# Patient Record
Sex: Male | Born: 2000 | Race: Black or African American | Hispanic: No | Marital: Single | Smoking: Never smoker
Health system: Southern US, Community
[De-identification: ages and names within clinical notes are randomized; demographics above are authoritative.]

## PROBLEM LIST (undated history)

## (undated) DIAGNOSIS — E119 Type 2 diabetes mellitus without complications: Secondary | ICD-10-CM

---
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
## 2019-05-16 ENCOUNTER — Encounter (HOSPITAL_COMMUNITY): Payer: Self-pay | Admitting: *Deleted

## 2019-05-16 ENCOUNTER — Other Ambulatory Visit: Payer: Self-pay

## 2019-05-16 ENCOUNTER — Ambulatory Visit (HOSPITAL_COMMUNITY)
Admission: EM | Admit: 2019-05-16 | Discharge: 2019-05-16 | Disposition: A | Discharge status: Home / Self Care | Payer: Medicaid Other | Attending: Urgent Care | Admitting: Urgent Care

## 2019-05-16 DIAGNOSIS — S39012A Strain of muscle, fascia and tendon of lower back, initial encounter: Secondary | ICD-10-CM | POA: Diagnosis not present

## 2019-05-16 DIAGNOSIS — M545 Low back pain, unspecified: Secondary | ICD-10-CM

## 2019-05-16 MED ORDER — NAPROXEN 500 MG PO TABS: 500.0000 mg | ORAL_TABLET | Freq: Two times a day (BID) | ORAL | 0 refills | Status: DC

## 2019-05-16 MED ORDER — TIZANIDINE HCL 4 MG PO TABS: 4.0000 mg | ORAL_TABLET | Freq: Three times a day (TID) | ORAL | 0 refills | Status: DC | PRN
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
## 2019-05-16 NOTE — ED Provider Notes (Signed)
[facility]   MRN: [id number] DOB: [birth year]  Subjective:   Jacob Esparza is a 19 y.o. male presenting for 2 day hx of acute onset of low back pain after bending down to pick something up.  Had significant difficulty moving around thereafter due to his pain.  States that it radiates to both of his legs.  Has not tried any medications for relief.  He missed work yesterday and would like a note for work.  Denies fever, dysuria, urinary frequency, hematuria, history of kidney stone, history of back injury or surgery, weakness, incontinence.  Denies taking chronic medications.    No Known Allergies  History reviewed. No pertinent past medical history.   History reviewed. No pertinent surgical history.  Family History  Problem Relation Age of Onset  . Healthy Mother   . Diabetes Father     Social History   Tobacco Use  . Smoking status: Never Smoker  . Smokeless tobacco: Never Used  Substance Use Topics  . Alcohol use: Not Currently  . Drug use: Never    ROS   Objective:   Vitals: BP 138/82   Pulse 75   Temp 98.4 F (36.9 C) (Oral)   Resp 18   SpO2 100%   Physical Exam Constitutional:      General: He is not in acute distress.    Appearance: Normal appearance. He is well-developed and normal weight. He is not ill-appearing, toxic-appearing or diaphoretic.  HENT:     Head: Normocephalic and atraumatic.     Right Ear: External ear normal.     Left Ear: External ear normal.     Nose: Nose normal.     Mouth/Throat:     Pharynx: Oropharynx is clear.  Eyes:     General: No scleral icterus.       Right eye: No discharge.        Left eye: No discharge.     Extraocular Movements: Extraocular movements intact.     Pupils: Pupils are equal, round, and reactive to light.  Cardiovascular:     Rate and Rhythm: Normal rate.  Pulmonary:     Effort: Pulmonary effort is normal.  Musculoskeletal:     Cervical back: Normal range of motion.     Lumbar back:  Spasms (over paraspinal muscles) and tenderness present. No swelling, edema, deformity, signs of trauma, lacerations or bony tenderness. Decreased range of motion (full flexion and extension). Negative right straight leg raise test and negative left straight leg raise test. No scoliosis.       Back:  Neurological:     Mental Status: He is alert and oriented to person, place, and time.  Psychiatric:        Mood and Affect: Mood normal.        Behavior: Behavior normal.        Thought Content: Thought content normal.        Judgment: Judgment normal.      Assessment and Plan :   1. Strain of lumbar region, initial encounter   2. Acute bilateral low back pain without sciatica     Will use conservative management for lumbar strain including rest, NSAID, muscle relaxant.  Provided with note for work.  Counseled on back care.  Given lack of trauma and reassuring physical exam findings, vital signs will defer imaging. Counseled patient on potential for adverse effects with medications prescribed/recommended today, ER and return-to-clinic precautions discussed, patient verbalized understanding.    Wallis Bamberg, PA-C 05/16/19  1550  

## 2019-07-29 ENCOUNTER — Other Ambulatory Visit: Payer: Self-pay

## 2019-07-29 DIAGNOSIS — M545 Low back pain: Secondary | ICD-10-CM | POA: Insufficient documentation

## 2019-07-29 DIAGNOSIS — G8929 Other chronic pain: Secondary | ICD-10-CM | POA: Insufficient documentation

## 2019-07-30 ENCOUNTER — Encounter (HOSPITAL_COMMUNITY): Payer: Self-pay | Admitting: Emergency Medicine

## 2019-07-30 ENCOUNTER — Emergency Department (HOSPITAL_COMMUNITY)
Admission: EM | Admit: 2019-07-30 | Discharge: 2019-07-30 | Disposition: A | Discharge status: Home / Self Care | Payer: No Typology Code available for payment source | Attending: Emergency Medicine | Admitting: Emergency Medicine

## 2019-07-30 ENCOUNTER — Other Ambulatory Visit: Payer: Self-pay

## 2019-07-30 DIAGNOSIS — M545 Low back pain, unspecified: Secondary | ICD-10-CM

## 2019-07-30 MED ORDER — NAPROXEN 500 MG PO TABS: 500.0000 mg | ORAL_TABLET | Freq: Two times a day (BID) | ORAL | 0 refills | Status: DC

## 2019-07-30 MED ORDER — METHOCARBAMOL 500 MG PO TABS: 500.0000 mg | ORAL_TABLET | Freq: Two times a day (BID) | ORAL | 0 refills | Status: DC

## 2019-07-30 MED ORDER — LIDOCAINE 5 % EX PTCH
1.0000 | MEDICATED_PATCH | Freq: Once | CUTANEOUS | Status: DC
  Administered 2019-07-30: 1 via TRANSDERMAL
  Filled 2019-07-30: qty 1

## 2019-07-30 MED ORDER — LIDOCAINE 5 % EX PTCH: 1.0000 | MEDICATED_PATCH | CUTANEOUS | 0 refills | Status: DC
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
## 2019-07-30 NOTE — Discharge Instructions (Signed)
Please pick up medication and take as prescribed Follow up with [facility] for primary care needs. They will need to reassess you given your pain has been persistent for months.  Return to the ED for any worsening symptoms including worsening pain, inability to urinate, peeing or pooping on yourself, numbness in your groin area, inability to walk.
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
## 2019-11-22 ENCOUNTER — Emergency Department (HOSPITAL_COMMUNITY)
Admission: EM | Admit: 2019-11-22 | Discharge: 2019-11-23 | Disposition: A | Discharge status: Home / Self Care | Payer: Medicaid Other | Attending: Emergency Medicine | Admitting: Emergency Medicine

## 2019-11-22 ENCOUNTER — Encounter (HOSPITAL_COMMUNITY): Payer: Self-pay | Admitting: Emergency Medicine

## 2019-11-22 ENCOUNTER — Other Ambulatory Visit: Payer: Self-pay

## 2019-11-22 DIAGNOSIS — Z5321 Procedure and treatment not carried out due to patient leaving prior to being seen by health care provider: Secondary | ICD-10-CM | POA: Diagnosis not present

## 2019-11-22 DIAGNOSIS — R109 Unspecified abdominal pain: Secondary | ICD-10-CM | POA: Diagnosis not present

## 2019-11-22 LAB — CBC
HCT: 49.7 % (ref 39.0–52.0)
Hemoglobin: 16.8 g/dL (ref 13.0–17.0)
MCH: 29.2 pg (ref 26.0–34.0)
MCHC: 33.8 g/dL (ref 30.0–36.0)
MCV: 86.3 fL (ref 80.0–100.0)
Platelets: 230 10*3/uL (ref 150–400)
RBC: 5.76 MIL/uL (ref 4.22–5.81)
RDW: 12.6 % (ref 11.5–15.5)
WBC: 4.3 10*3/uL (ref 4.0–10.5)
nRBC: 0 % (ref 0.0–0.2)

## 2019-11-22 NOTE — ED Triage Notes (Signed)
Pt c/o abdominal pain x 2 days. Denies nausea/vomiting/diarrhea.

## 2019-11-22 NOTE — ED Notes (Signed)
Pt found sitting on the floor in triage room. States he doesn't feel well, like he is going to pass out.

## 2019-11-22 NOTE — ED Notes (Signed)
Pt called for triage, no response. 

## 2019-11-23 LAB — LIPASE, BLOOD: Lipase: 27 U/L (ref 11–51)

## 2019-11-23 LAB — COMPREHENSIVE METABOLIC PANEL
ALT: 48 U/L — ABNORMAL HIGH (ref 0–44)
AST: 33 U/L (ref 15–41)
Albumin: 4.6 g/dL (ref 3.5–5.0)
Alkaline Phosphatase: 122 U/L (ref 38–126)
Anion gap: 17 — ABNORMAL HIGH (ref 5–15)
BUN: 12 mg/dL (ref 6–20)
CO2: 20 mmol/L — ABNORMAL LOW (ref 22–32)
Calcium: 10.2 mg/dL (ref 8.9–10.3)
Chloride: 96 mmol/L — ABNORMAL LOW (ref 98–111)
Creatinine, Ser: 1.36 mg/dL — ABNORMAL HIGH (ref 0.61–1.24)
GFR calc Af Amer: >60 mL/min (ref >60)
GFR calc non Af Amer: >60 mL/min (ref >60)
Glucose, Bld: 460 mg/dL — ABNORMAL HIGH (ref 70–99)
Potassium: 4.7 mmol/L (ref 3.5–5.1)
Sodium: 133 mmol/L — ABNORMAL LOW (ref 135–145)
Total Bilirubin: 1.2 mg/dL (ref 0.3–1.2)
Total Protein: 9.3 g/dL — ABNORMAL HIGH (ref 6.5–8.1)

## 2019-11-23 NOTE — ED Notes (Signed)
Pt left the waiting room

## 2019-11-24 ENCOUNTER — Inpatient Hospital Stay (HOSPITAL_COMMUNITY)
Admission: EM | Admit: 2019-11-24 | Discharge: 2019-11-28 | DRG: 177 | Disposition: A | Discharge status: Home / Self Care | Payer: Medicaid Other | Attending: Internal Medicine | Admitting: Internal Medicine

## 2019-11-24 ENCOUNTER — Emergency Department (HOSPITAL_COMMUNITY): Payer: Medicaid Other

## 2019-11-24 ENCOUNTER — Encounter (HOSPITAL_COMMUNITY): Payer: Self-pay | Admitting: Obstetrics and Gynecology

## 2019-11-24 ENCOUNTER — Other Ambulatory Visit: Payer: Self-pay

## 2019-11-24 DIAGNOSIS — Z833 Family history of diabetes mellitus: Secondary | ICD-10-CM | POA: Diagnosis not present

## 2019-11-24 DIAGNOSIS — E876 Hypokalemia: Secondary | ICD-10-CM | POA: Diagnosis not present

## 2019-11-24 DIAGNOSIS — J1282 Pneumonia due to coronavirus disease 2019: Secondary | ICD-10-CM

## 2019-11-24 DIAGNOSIS — E875 Hyperkalemia: Secondary | ICD-10-CM | POA: Diagnosis present

## 2019-11-24 DIAGNOSIS — U071 COVID-19: Secondary | ICD-10-CM | POA: Diagnosis present

## 2019-11-24 DIAGNOSIS — E860 Dehydration: Secondary | ICD-10-CM | POA: Diagnosis present

## 2019-11-24 DIAGNOSIS — D751 Secondary polycythemia: Secondary | ICD-10-CM | POA: Diagnosis present

## 2019-11-24 DIAGNOSIS — R112 Nausea with vomiting, unspecified: Secondary | ICD-10-CM | POA: Diagnosis present

## 2019-11-24 DIAGNOSIS — Z6841 Body Mass Index (BMI) 40.0 and over, adult: Secondary | ICD-10-CM

## 2019-11-24 DIAGNOSIS — E119 Type 2 diabetes mellitus without complications: Secondary | ICD-10-CM

## 2019-11-24 DIAGNOSIS — E1110 Type 2 diabetes mellitus with ketoacidosis without coma: Secondary | ICD-10-CM | POA: Diagnosis present

## 2019-11-24 DIAGNOSIS — E0810 Diabetes mellitus due to underlying condition with ketoacidosis without coma: Secondary | ICD-10-CM

## 2019-11-24 DIAGNOSIS — N179 Acute kidney failure, unspecified: Secondary | ICD-10-CM | POA: Diagnosis present

## 2019-11-24 LAB — CBC
HCT: 58.3 % — ABNORMAL HIGH (ref 39.0–52.0)
Hemoglobin: 18.8 g/dL — ABNORMAL HIGH (ref 13.0–17.0)
MCH: 29.6 pg (ref 26.0–34.0)
MCHC: 32.2 g/dL (ref 30.0–36.0)
MCV: 91.8 fL (ref 80.0–100.0)
Platelets: 264 10*3/uL (ref 150–400)
RBC: 6.35 MIL/uL — ABNORMAL HIGH (ref 4.22–5.81)
RDW: 13.3 % (ref 11.5–15.5)
WBC: 8.4 10*3/uL (ref 4.0–10.5)
nRBC: 0 % (ref 0.0–0.2)

## 2019-11-24 LAB — COMPREHENSIVE METABOLIC PANEL
ALT: 45 U/L — ABNORMAL HIGH (ref 0–44)
AST: 39 U/L (ref 15–41)
Albumin: 4.7 g/dL (ref 3.5–5.0)
Alkaline Phosphatase: 123 U/L (ref 38–126)
BUN: 27 mg/dL — ABNORMAL HIGH (ref 6–20)
CO2: <7 mmol/L — ABNORMAL LOW (ref 22–32)
Calcium: 9.6 mg/dL (ref 8.9–10.3)
Chloride: 96 mmol/L — ABNORMAL LOW (ref 98–111)
Creatinine, Ser: 1.87 mg/dL — ABNORMAL HIGH (ref 0.61–1.24)
GFR calc Af Amer: 59 mL/min — ABNORMAL LOW (ref >60)
GFR calc non Af Amer: 51 mL/min — ABNORMAL LOW (ref >60)
Glucose, Bld: 697 mg/dL (ref 70–99)
Potassium: 6 mmol/L — ABNORMAL HIGH (ref 3.5–5.1)
Sodium: 130 mmol/L — ABNORMAL LOW (ref 135–145)
Total Bilirubin: 1.5 mg/dL — ABNORMAL HIGH (ref 0.3–1.2)
Total Protein: 10.3 g/dL — ABNORMAL HIGH (ref 6.5–8.1)

## 2019-11-24 LAB — URINALYSIS, ROUTINE W REFLEX MICROSCOPIC
Bilirubin Urine: NEGATIVE
Glucose, UA: >=500 mg/dL — AB
Ketones, ur: 80 mg/dL — AB
Leukocytes,Ua: NEGATIVE
Nitrite: NEGATIVE
Protein, ur: >=300 mg/dL — AB
Specific Gravity, Urine: 1.025 (ref 1.005–1.030)
pH: 5 (ref 5.0–8.0)

## 2019-11-24 LAB — BLOOD GAS, VENOUS
Acid-base deficit: 25.7 mmol/L — ABNORMAL HIGH (ref 0.0–2.0)
Bicarbonate: 5.5 mmol/L — ABNORMAL LOW (ref 20.0–28.0)
O2 Saturation: 85.8 %
Patient temperature: 98.6
pCO2, Ven: 19.7 mmHg — CL (ref 44.0–60.0)
pH, Ven: 7.076 — CL (ref 7.250–7.430)
pO2, Ven: 66.7 mmHg — ABNORMAL HIGH (ref 32.0–45.0)

## 2019-11-24 LAB — CBG MONITORING, ED: Glucose-Capillary: >600 mg/dL (ref 70–99)

## 2019-11-24 LAB — SARS CORONAVIRUS 2 BY RT PCR (HOSPITAL ORDER, PERFORMED IN ~~LOC~~ HOSPITAL LAB): SARS Coronavirus 2: POSITIVE — AB

## 2019-11-24 LAB — LIPASE, BLOOD: Lipase: 52 U/L — ABNORMAL HIGH (ref 11–51)

## 2019-11-24 MED ORDER — DEXTROSE 50 % IV SOLN: 0.0000 mL | INTRAVENOUS | Status: DC | PRN

## 2019-11-24 MED ORDER — SODIUM CHLORIDE 0.9 % IV BOLUS
1000.0000 mL | Freq: Once | INTRAVENOUS | Status: AC
  Administered 2019-11-24: 1000 mL via INTRAVENOUS

## 2019-11-24 MED ORDER — ONDANSETRON HCL 4 MG/2ML IJ SOLN
4.0000 mg | Freq: Once | INTRAMUSCULAR | Status: AC
  Administered 2019-11-24: 4 mg via INTRAVENOUS
  Filled 2019-11-24: qty 2

## 2019-11-24 MED ORDER — INSULIN REGULAR(HUMAN) IN NACL 100-0.9 UT/100ML-% IV SOLN
INTRAVENOUS | Status: DC
  Administered 2019-11-25: 6 [IU]/h via INTRAVENOUS
  Filled 2019-11-24: qty 100

## 2019-11-24 MED ORDER — LACTATED RINGERS IV BOLUS
20.0000 mL/kg | Freq: Once | INTRAVENOUS | Status: DC
  Administered 2019-11-25: 2704 mL via INTRAVENOUS

## 2019-11-24 MED ORDER — DEXTROSE IN LACTATED RINGERS 5 % IV SOLN: INTRAVENOUS | Status: DC

## 2019-11-24 MED ORDER — STERILE WATER FOR INJECTION IV SOLN
INTRAVENOUS | Status: DC
  Filled 2019-11-24 (×2): qty 850
  Filled 2019-11-24: qty 150
  Filled 2019-11-24: qty 850
  Filled 2019-11-24: qty 150
  Filled 2019-11-24: qty 850
  Filled 2019-11-24: qty 150

## 2019-11-24 MED ORDER — LACTATED RINGERS IV SOLN: INTRAVENOUS | Status: DC

## 2019-11-24 NOTE — ED Provider Notes (Signed)
[facility]-EMERGENCY DEPT Provider Note   CSN: [id number] Arrival date & time: 11/24/19  1834     History Chief Complaint  Patient presents with   Emesis   Covid Exposure    Breylin R Nevel is a 19 y.o. male who presents for evaluation of 4 days of generalized abdominal pain, nausea/vomiting.  He states he has had body aches, subjective fever/chills somebody who had Covid.  He states he did not get Covid vaccinated. He has had some occasional SOB but no CP.  He states he has not been able to keep anything down for the last 2 days.  Emesis is nonbloody, nonbilious.  He has had some occasional episodes of diarrhea.  He has not measured his temperature but has felt hot.  He has been more tired and states he has not had any energy.  Patient states he does not smoke.  Denies any alcohol use.  Denies any prior history of diabetes.   The history is provided by the patient.       No past medical history on file.  Patient Active Problem List   Diagnosis Date Noted   DKA (diabetic ketoacidoses) (HCC) 11/24/2019    No past surgical history on file.     Family History  Problem Relation Age of Onset   Healthy Mother    Diabetes Father     Social History   Tobacco Use   Smoking status: Never Smoker   Smokeless tobacco: Never Used  Building services engineer Use: Never used  Substance Use Topics   Alcohol use: Not Currently   Drug use: Never    Home Medications Prior to Admission medications   Medication Sig Start Date End Date Taking? Authorizing Provider  lidocaine (LIDODERM) 5 % Place 1 patch onto the skin daily. Remove & Discard patch within 12 hours or as directed by MD Patient not taking: Reported on 11/24/2019 07/30/19   Tanda Rockers, PA-C  methocarbamol (ROBAXIN) 500 MG tablet Take 1 tablet (500 mg total) by mouth 2 (two) times daily. Patient not taking: Reported on 11/24/2019 07/30/19   Tanda Rockers, PA-C  naproxen (NAPROSYN) 500 MG tablet  Take 1 tablet (500 mg total) by mouth 2 (two) times daily. Patient not taking: Reported on 11/24/2019 07/30/19   Tanda Rockers, PA-C  tiZANidine (ZANAFLEX) 4 MG tablet Take 1 tablet (4 mg total) by mouth every 8 (eight) hours as needed for muscle spasms. Patient not taking: Reported on 11/24/2019 05/16/19   Wallis Bamberg, PA-C    Allergies    Patient has no known allergies.  Review of Systems   Review of Systems  Constitutional: Positive for appetite change, fatigue and fever.  Respiratory: Positive for shortness of breath. Negative for cough.   Cardiovascular: Negative for chest pain.  Gastrointestinal: Positive for abdominal pain, diarrhea, nausea and vomiting.  Genitourinary: Negative for dysuria and hematuria.  Neurological: Negative for headaches.  All other systems reviewed and are negative.   Physical Exam Updated Vital Signs BP (!) 148/115    Pulse (!) 110    Temp 97.7 F (36.5 C) (Oral)    Resp 18    SpO2 96%   Physical Exam Vitals and nursing note reviewed.  Constitutional:      Appearance: Normal appearance. He is well-developed. He is ill-appearing.  HENT:     Head: Normocephalic and atraumatic.  Eyes:     General: Lids are normal.     Conjunctiva/sclera: Conjunctivae normal.  Pupils: Pupils are equal, round, and reactive to light.  Cardiovascular:     Rate and Rhythm: Regular rhythm. Tachycardia present.     Pulses: Normal pulses.     Heart sounds: Normal heart sounds. No murmur heard.  No friction rub. No gallop.   Pulmonary:     Effort: Tachypnea present.     Breath sounds: Normal breath sounds.     Comments: Lungs clear to auscultation bilaterally.  Symmetric chest rise.  No wheezing, rales, rhonchi. Abdominal:     Palpations: Abdomen is soft. Abdomen is not rigid.     Tenderness: There is generalized abdominal tenderness. There is no guarding.     Comments: Abdomen is soft, non-distended. Generalized tenderness noted diffusely. No rigidity, guarding.     Musculoskeletal:        General: Normal range of motion.     Cervical back: Full passive range of motion without pain.  Skin:    General: Skin is warm and moist.     Capillary Refill: Capillary refill takes less than 2 seconds.     Comments: Diaphoretic   Neurological:     Mental Status: He is alert and oriented to person, place, and time.  Psychiatric:        Speech: Speech normal.     ED Results / Procedures / Treatments   Labs (all labs ordered are listed, but only abnormal results are displayed) Labs Reviewed  SARS CORONAVIRUS 2 BY RT PCR (HOSPITAL ORDER, PERFORMED IN [facility]) - Abnormal; Notable for the following components:      Result Value   SARS Coronavirus 2 POSITIVE (*)    All other components within normal limits  LIPASE, BLOOD - Abnormal; Notable for the following components:   Lipase 52 (*)    All other components within normal limits  COMPREHENSIVE METABOLIC PANEL - Abnormal; Notable for the following components:   Sodium 130 (*)    Potassium 6.0 (*)    Chloride 96 (*)    CO2 <7 (*)    Glucose, Bld 697 (*)    BUN 27 (*)    Creatinine, Ser 1.87 (*)    Total Protein 10.3 (*)    ALT 45 (*)    Total Bilirubin 1.5 (*)    GFR calc non Af Amer 51 (*)    GFR calc Af Amer 59 (*)    All other components within normal limits  CBC - Abnormal; Notable for the following components:   RBC 6.35 (*)    Hemoglobin 18.8 (*)    HCT 58.3 (*)    All other components within normal limits  URINALYSIS, ROUTINE W REFLEX MICROSCOPIC - Abnormal; Notable for the following components:   Color, Urine STRAW (*)    Glucose, UA >=500 (*)    Hgb urine dipstick MODERATE (*)    Ketones, ur 80 (*)    Protein, ur >=300 (*)    Bacteria, UA RARE (*)    All other components within normal limits  BLOOD GAS, VENOUS - Abnormal; Notable for the following components:   pH, Ven 7.076 (*)    pCO2, Ven 19.7 (*)    pO2, Ven 66.7 (*)    Bicarbonate 5.5 (*)    Acid-base deficit  25.7 (*)    All other components within normal limits  CBG MONITORING, ED - Abnormal; Notable for the following components:   Glucose-Capillary >600 (*)    All other components within normal limits  BETA-HYDROXYBUTYRIC ACID  BETA-HYDROXYBUTYRIC ACID  BETA-HYDROXYBUTYRIC ACID    EKG EKG Interpretation  Date/Time:  Tuesday November 24 2019 23:15:00 EDT Ventricular Rate:  134 PR Interval:    QRS Duration: 85 QT Interval:  311 QTC Calculation: 465 R Axis:   84 Text Interpretation: Sinus tachycardia Borderline T wave abnormalities ST elev, probable normal early repol pattern t wave amplitude increased since last tracing Confirmed by Linwood DibblesKnapp, Jon [phone]([id number]) on 11/24/2019 11:28:25 PM   Radiology DG Chest Portable 1 View  Result Date: 11/24/2019 CLINICAL DATA:  Shortness of breath, nausea, COVID. EXAM: PORTABLE CHEST 1 VIEW COMPARISON:  None. FINDINGS: Low lung volumes. Patchy heterogeneous bilateral airspace opacities in a mid-lower lung zone predominant distribution, right greater than left. Heart is normal in size with normal mediastinal contours. No pleural effusion. No pneumothorax or evidence of pneumomediastinum. No acute osseous abnormalities are seen. IMPRESSION: Patchy bilateral airspace opacities in a mid-lower lung zone predominant distribution, pattern consistent with COVID-19 pneumonia. Electronically Signed   By: Narda RutherfordMelanie  Sanford M.D.   On: 11/24/2019 23:08    Procedures .Critical Care Performed by: Maxwell CaulLayden, Mykell Rawl A, PA-C Authorized by: Maxwell CaulLayden, Bernarr Longsworth A, PA-C   Critical care provider statement:    Critical care time (minutes):  35   Critical care was necessary to treat or prevent imminent or life-threatening deterioration of the following conditions:  Metabolic crisis   Critical care was time spent personally by me on the following activities:  Discussions with consultants, evaluation of patient's response to treatment, examination of patient, ordering and performing treatments  and interventions, ordering and review of laboratory studies, ordering and review of radiographic studies, pulse oximetry, re-evaluation of patient's condition, obtaining history from patient or surrogate and review of old charts   (including critical care time)  Medications Ordered in ED Medications  lactated ringers bolus 20 mL/kg (has no administration in time range)  insulin regular, human (MYXREDLIN) 100 units/ 100 mL infusion (has no administration in time range)  lactated ringers infusion (has no administration in time range)  dextrose 5 % in lactated ringers infusion (has no administration in time range)  dextrose 50 % solution 0-50 mL (has no administration in time range)  sodium bicarbonate 150 mEq in sterile water 1,000 mL infusion (has no administration in time range)  sodium chloride 0.9 % bolus 1,000 mL (1,000 mLs Intravenous New Bag/Given 11/24/19 2300)  ondansetron (ZOFRAN) injection 4 mg (4 mg Intravenous Given 11/24/19 2259)    ED Course  I have reviewed the triage vital signs and the nursing notes.  Pertinent labs & imaging results that were available during my care of the patient were reviewed by me and considered in my medical decision making (see chart for details).    MDM Rules/Calculators/A&P                           19 year-old male who presents for evaluation of 4 days of generalized abdominal pain, nausea/vomiting.  Associated with subjective fever/chills, shortness of breath, fatigue.  He has not gotten vaccinated.  On initially arrival, he is afebrile but is tachycardic, slightly hypertensive.  On exam, he has diffuse abdominal tenderness with no rigidity, guarding.  Lungs clear to auscultation.  Concern for intra-abdominal process, COVID-19 infection.  Labs ordered at triage.  Lipase is elevated at 52.  CMP shows potassium of 6, glucose of 697, bicarb of 7, BUN of 27, creatinine of 1.87.  Anion gap is not calculated but is manually done at 25.3.  He  is Covid  positive.  CBC shows no leukocytosis.  Hemoglobin is 18.8.  Discussed results with patient.  Patient with no prior history of diabetes.  Concern for DKA.  VBG, insulin drip added. Given concerns for COVID 19 as well as new onset diabetes with DKA, will plan fro admission.  Discussed patient with Dr. Loney Loh (hospitalist). She will accept patient for admission. She requests that patient be started on a bicarb drip at 17ml/hr in sterile water.    PH is 7.076. Bicarb is 5.5.   Rc MUHANAD TOROSYAN was evaluated in Emergency Department on 11/25/2019 for the symptoms described in the history of present illness. He was evaluated in the context of the global COVID-19 pandemic, which necessitated consideration that the patient might be at risk for infection with the SARS-CoV-2 virus that causes COVID-19. Institutional protocols and algorithms that pertain to the evaluation of patients at risk for COVID-19 are in a state of rapid change based on information released by regulatory bodies including the CDC and federal and state organizations. These policies and algorithms were followed during the patient's care in the ED.  Portions of this note were generated with Scientist, clinical (histocompatibility and immunogenetics). Dictation errors may occur despite best attempts at proofreading.   Final Clinical Impression(s) / ED Diagnoses Final diagnoses:  COVID-19  Diabetic ketoacidosis without coma associated with diabetes mellitus due to underlying condition (HCC)  Hyperkalemia    Rx / DC Orders ED Discharge Orders    None       Rosana Hoes 11/25/19 0003    Linwood Dibbles, MD 11/26/19 [phone]

## 2019-11-24 NOTE — ED Triage Notes (Signed)
Patient reports to the ER from home for emesis and possible COVID. Patient reports it has been going on x4 days. VSS.  CBG 289

## 2019-11-25 ENCOUNTER — Encounter (HOSPITAL_COMMUNITY): Payer: Self-pay | Admitting: Internal Medicine

## 2019-11-25 DIAGNOSIS — D751 Secondary polycythemia: Secondary | ICD-10-CM

## 2019-11-25 DIAGNOSIS — E1110 Type 2 diabetes mellitus with ketoacidosis without coma: Secondary | ICD-10-CM

## 2019-11-25 DIAGNOSIS — R112 Nausea with vomiting, unspecified: Secondary | ICD-10-CM

## 2019-11-25 DIAGNOSIS — E119 Type 2 diabetes mellitus without complications: Secondary | ICD-10-CM

## 2019-11-25 DIAGNOSIS — J1282 Pneumonia due to coronavirus disease 2019: Secondary | ICD-10-CM

## 2019-11-25 DIAGNOSIS — U071 COVID-19: Principal | ICD-10-CM

## 2019-11-25 LAB — HEMOGLOBIN A1C
Hgb A1c MFr Bld: 10.7 % — ABNORMAL HIGH (ref 4.8–5.6)
Mean Plasma Glucose: 260.39 mg/dL

## 2019-11-25 LAB — BASIC METABOLIC PANEL
Anion gap: 29 — ABNORMAL HIGH (ref 5–15)
Anion gap: 25 — ABNORMAL HIGH (ref 5–15)
Anion gap: 19 — ABNORMAL HIGH (ref 5–15)
Anion gap: 16 — ABNORMAL HIGH (ref 5–15)
Anion gap: 16 — ABNORMAL HIGH (ref 5–15)
BUN: 29 mg/dL — ABNORMAL HIGH (ref 6–20)
BUN: 26 mg/dL — ABNORMAL HIGH (ref 6–20)
BUN: 22 mg/dL — ABNORMAL HIGH (ref 6–20)
BUN: 18 mg/dL (ref 6–20)
BUN: 16 mg/dL (ref 6–20)
CO2: 7 mmol/L — ABNORMAL LOW (ref 22–32)
CO2: 9 mmol/L — ABNORMAL LOW (ref 22–32)
CO2: 12 mmol/L — ABNORMAL LOW (ref 22–32)
CO2: 15 mmol/L — ABNORMAL LOW (ref 22–32)
CO2: 15 mmol/L — ABNORMAL LOW (ref 22–32)
Calcium: 9.2 mg/dL (ref 8.9–10.3)
Calcium: 9.4 mg/dL (ref 8.9–10.3)
Calcium: 8.8 mg/dL — ABNORMAL LOW (ref 8.9–10.3)
Calcium: 8.5 mg/dL — ABNORMAL LOW (ref 8.9–10.3)
Calcium: 8.7 mg/dL — ABNORMAL LOW (ref 8.9–10.3)
Chloride: 98 mmol/L (ref 98–111)
Chloride: 106 mmol/L (ref 98–111)
Chloride: 108 mmol/L (ref 98–111)
Chloride: 104 mmol/L (ref 98–111)
Chloride: 105 mmol/L (ref 98–111)
Creatinine, Ser: 1.73 mg/dL — ABNORMAL HIGH (ref 0.61–1.24)
Creatinine, Ser: 1.67 mg/dL — ABNORMAL HIGH (ref 0.61–1.24)
Creatinine, Ser: 1.48 mg/dL — ABNORMAL HIGH (ref 0.61–1.24)
Creatinine, Ser: 1.15 mg/dL (ref 0.61–1.24)
Creatinine, Ser: 0.93 mg/dL (ref 0.61–1.24)
GFR calc Af Amer: >60 mL/min (ref >60)
GFR calc Af Amer: >60 mL/min (ref >60)
GFR calc Af Amer: >60 mL/min (ref >60)
GFR calc Af Amer: >60 mL/min (ref >60)
GFR calc Af Amer: >60 mL/min (ref >60)
GFR calc non Af Amer: 56 mL/min — ABNORMAL LOW (ref >60)
GFR calc non Af Amer: 58 mL/min — ABNORMAL LOW (ref >60)
GFR calc non Af Amer: >60 mL/min (ref >60)
GFR calc non Af Amer: >60 mL/min (ref >60)
GFR calc non Af Amer: >60 mL/min (ref >60)
Glucose, Bld: 609 mg/dL (ref 70–99)
Glucose, Bld: 439 mg/dL — ABNORMAL HIGH (ref 70–99)
Glucose, Bld: 273 mg/dL — ABNORMAL HIGH (ref 70–99)
Glucose, Bld: 263 mg/dL — ABNORMAL HIGH (ref 70–99)
Glucose, Bld: 247 mg/dL — ABNORMAL HIGH (ref 70–99)
Potassium: 5.5 mmol/L — ABNORMAL HIGH (ref 3.5–5.1)
Potassium: 5.6 mmol/L — ABNORMAL HIGH (ref 3.5–5.1)
Potassium: 4.4 mmol/L (ref 3.5–5.1)
Potassium: 4 mmol/L (ref 3.5–5.1)
Potassium: 3.6 mmol/L (ref 3.5–5.1)
Sodium: 134 mmol/L — ABNORMAL LOW (ref 135–145)
Sodium: 140 mmol/L (ref 135–145)
Sodium: 139 mmol/L (ref 135–145)
Sodium: 135 mmol/L (ref 135–145)
Sodium: 136 mmol/L (ref 135–145)

## 2019-11-25 LAB — CBG MONITORING, ED
Glucose-Capillary: >600 mg/dL (ref 70–99)
Glucose-Capillary: >600 mg/dL (ref 70–99)
Glucose-Capillary: 500 mg/dL — ABNORMAL HIGH (ref 70–99)
Glucose-Capillary: >600 mg/dL (ref 70–99)
Glucose-Capillary: 539 mg/dL (ref 70–99)
Glucose-Capillary: 478 mg/dL — ABNORMAL HIGH (ref 70–99)
Glucose-Capillary: 488 mg/dL — ABNORMAL HIGH (ref 70–99)
Glucose-Capillary: 402 mg/dL — ABNORMAL HIGH (ref 70–99)
Glucose-Capillary: 423 mg/dL — ABNORMAL HIGH (ref 70–99)
Glucose-Capillary: 372 mg/dL — ABNORMAL HIGH (ref 70–99)
Glucose-Capillary: 406 mg/dL — ABNORMAL HIGH (ref 70–99)
Glucose-Capillary: 319 mg/dL — ABNORMAL HIGH (ref 70–99)
Glucose-Capillary: 310 mg/dL — ABNORMAL HIGH (ref 70–99)
Glucose-Capillary: 284 mg/dL — ABNORMAL HIGH (ref 70–99)
Glucose-Capillary: 263 mg/dL — ABNORMAL HIGH (ref 70–99)
Glucose-Capillary: 223 mg/dL — ABNORMAL HIGH (ref 70–99)
Glucose-Capillary: 207 mg/dL — ABNORMAL HIGH (ref 70–99)
Glucose-Capillary: 245 mg/dL — ABNORMAL HIGH (ref 70–99)

## 2019-11-25 LAB — FIBRINOGEN: Fibrinogen: 714 mg/dL — ABNORMAL HIGH (ref 210–475)

## 2019-11-25 LAB — BETA-HYDROXYBUTYRIC ACID
Beta-Hydroxybutyric Acid: >8 mmol/L — ABNORMAL HIGH (ref 0.05–0.27)
Beta-Hydroxybutyric Acid: >8 mmol/L — ABNORMAL HIGH (ref 0.05–0.27)
Beta-Hydroxybutyric Acid: 6.7 mmol/L — ABNORMAL HIGH (ref 0.05–0.27)

## 2019-11-25 LAB — LACTATE DEHYDROGENASE: LDH: 216 U/L — ABNORMAL HIGH (ref 98–192)

## 2019-11-25 LAB — C-REACTIVE PROTEIN: CRP: 5 mg/dL — ABNORMAL HIGH (ref <1.0)

## 2019-11-25 LAB — CBC
HCT: 51.6 % (ref 39.0–52.0)
Hemoglobin: 17.2 g/dL — ABNORMAL HIGH (ref 13.0–17.0)
MCH: 29.7 pg (ref 26.0–34.0)
MCHC: 33.3 g/dL (ref 30.0–36.0)
MCV: 89 fL (ref 80.0–100.0)
Platelets: 228 10*3/uL (ref 150–400)
RBC: 5.8 MIL/uL (ref 4.22–5.81)
RDW: 13.8 % (ref 11.5–15.5)
WBC: 10.6 10*3/uL — ABNORMAL HIGH (ref 4.0–10.5)
nRBC: 0 % (ref 0.0–0.2)

## 2019-11-25 LAB — FERRITIN: Ferritin: 1155 ng/mL — ABNORMAL HIGH (ref 24–336)

## 2019-11-25 LAB — GLUCOSE, CAPILLARY
Glucose-Capillary: 221 mg/dL — ABNORMAL HIGH (ref 70–99)
Glucose-Capillary: 222 mg/dL — ABNORMAL HIGH (ref 70–99)
Glucose-Capillary: 231 mg/dL — ABNORMAL HIGH (ref 70–99)
Glucose-Capillary: 244 mg/dL — ABNORMAL HIGH (ref 70–99)
Glucose-Capillary: 247 mg/dL — ABNORMAL HIGH (ref 70–99)
Glucose-Capillary: 254 mg/dL — ABNORMAL HIGH (ref 70–99)
Glucose-Capillary: 283 mg/dL — ABNORMAL HIGH (ref 70–99)

## 2019-11-25 LAB — D-DIMER, QUANTITATIVE: D-Dimer, Quant: 0.95 ug/mL-FEU — ABNORMAL HIGH (ref 0.00–0.50)

## 2019-11-25 LAB — MRSA PCR SCREENING: MRSA by PCR: NEGATIVE

## 2019-11-25 LAB — PROCALCITONIN: Procalcitonin: 0.77 ng/mL

## 2019-11-25 LAB — HIV ANTIBODY (ROUTINE TESTING W REFLEX): HIV Screen 4th Generation wRfx: NONREACTIVE

## 2019-11-25 MED ORDER — DEXTROSE-NACL 5-0.45 % IV SOLN: INTRAVENOUS | Status: DC

## 2019-11-25 MED ORDER — GUAIFENESIN-DM 100-10 MG/5ML PO SYRP: 10.0000 mL | ORAL_SOLUTION | ORAL | Status: DC | PRN

## 2019-11-25 MED ORDER — ONDANSETRON HCL 4 MG/2ML IJ SOLN
4.0000 mg | Freq: Four times a day (QID) | INTRAMUSCULAR | Status: DC | PRN
  Administered 2019-11-26: 4 mg via INTRAVENOUS
  Filled 2019-11-25: qty 2

## 2019-11-25 MED ORDER — DEXAMETHASONE SODIUM PHOSPHATE 10 MG/ML IJ SOLN
6.0000 mg | Freq: Every day | INTRAMUSCULAR | Status: DC
  Administered 2019-11-25: 6 mg via INTRAVENOUS
  Filled 2019-11-25: qty 1

## 2019-11-25 MED ORDER — HYDROCOD POLST-CPM POLST ER 10-8 MG/5ML PO SUER: 5.0000 mL | Freq: Two times a day (BID) | ORAL | Status: DC | PRN

## 2019-11-25 MED ORDER — SODIUM CHLORIDE 0.9 % IV SOLN: INTRAVENOUS | Status: DC

## 2019-11-25 MED ORDER — INSULIN REGULAR(HUMAN) IN NACL 100-0.9 UT/100ML-% IV SOLN
INTRAVENOUS | Status: DC
  Administered 2019-11-26: 7 [IU]/h via INTRAVENOUS
  Filled 2019-11-25 (×3): qty 100

## 2019-11-25 MED ORDER — ASCORBIC ACID 500 MG PO TABS
500.0000 mg | ORAL_TABLET | Freq: Every day | ORAL | Status: DC
  Administered 2019-11-25 – 2019-11-28 (×4): 500 mg via ORAL
  Filled 2019-11-25 (×4): qty 1

## 2019-11-25 MED ORDER — LIVING WELL WITH DIABETES BOOK
Freq: Once | Status: AC
  Administered 2019-11-26: 1
  Filled 2019-11-25 (×2): qty 1

## 2019-11-25 MED ORDER — CHLORHEXIDINE GLUCONATE CLOTH 2 % EX PADS
6.0000 | MEDICATED_PAD | Freq: Every day | CUTANEOUS | Status: DC
  Administered 2019-11-25 – 2019-11-28 (×4): 6 via TOPICAL

## 2019-11-25 MED ORDER — DEXTROSE 50 % IV SOLN: 0.0000 mL | INTRAVENOUS | Status: DC | PRN

## 2019-11-25 MED ORDER — HEPARIN SODIUM (PORCINE) 5000 UNIT/ML IJ SOLN: 5000.0000 [IU] | Freq: Three times a day (TID) | INTRAMUSCULAR | Status: DC

## 2019-11-25 MED ORDER — SODIUM CHLORIDE 0.9 % IV SOLN
200.0000 mg | Freq: Once | INTRAVENOUS | Status: AC
  Administered 2019-11-25: 200 mg via INTRAVENOUS
  Filled 2019-11-25: qty 200

## 2019-11-25 MED ORDER — CALCIUM GLUCONATE-NACL 1-0.675 GM/50ML-% IV SOLN
1.0000 g | Freq: Once | INTRAVENOUS | Status: AC
  Administered 2019-11-25: 1000 mg via INTRAVENOUS
  Filled 2019-11-25: qty 50

## 2019-11-25 MED ORDER — ALBUTEROL SULFATE HFA 108 (90 BASE) MCG/ACT IN AERS: 2.0000 | INHALATION_SPRAY | Freq: Four times a day (QID) | RESPIRATORY_TRACT | Status: DC | PRN

## 2019-11-25 MED ORDER — ACETAMINOPHEN 325 MG PO TABS: 650.0000 mg | ORAL_TABLET | Freq: Four times a day (QID) | ORAL | Status: DC | PRN

## 2019-11-25 MED ORDER — SODIUM CHLORIDE 0.9 % IV SOLN
100.0000 mg | Freq: Every day | INTRAVENOUS | Status: AC
  Administered 2019-11-25 – 2019-11-28 (×4): 100 mg via INTRAVENOUS
  Filled 2019-11-25 (×4): qty 20

## 2019-11-25 MED ORDER — ENOXAPARIN SODIUM 80 MG/0.8ML ~~LOC~~ SOLN
70.0000 mg | Freq: Every day | SUBCUTANEOUS | Status: DC
  Administered 2019-11-25 – 2019-11-27 (×4): 70 mg via SUBCUTANEOUS
  Filled 2019-11-25 (×5): qty 0.7

## 2019-11-25 MED ORDER — ZINC SULFATE 220 (50 ZN) MG PO CAPS
220.0000 mg | ORAL_CAPSULE | Freq: Every day | ORAL | Status: DC
  Administered 2019-11-25 – 2019-11-28 (×4): 220 mg via ORAL
  Filled 2019-11-25 (×4): qty 1

## 2019-11-25 MED ORDER — VITAMIN D 25 MCG (1000 UNIT) PO TABS
1000.0000 [IU] | ORAL_TABLET | Freq: Every day | ORAL | Status: DC
  Administered 2019-11-25 – 2019-11-28 (×4): 1000 [IU] via ORAL
  Filled 2019-11-25 (×4): qty 1

## 2019-11-25 NOTE — ED Notes (Signed)
Called mother and given updated.

## 2019-11-25 NOTE — H&P (Addendum)
History and Physical    BRANON SABINE WVP:[id number] DOB: Feb 23, 2001 DOA: 11/24/2019  PCP: System, Provider Not In Patient coming from: Home  Chief Complaint: Abdominal pain, vomiting  HPI: Jacob Esparza is a 19 y.o. male with medical history significant of obesity presenting to the ED with complaints of abdominal pain, nausea, and vomiting. Patient reports feeling ill for the past 2 days. He is having generalized abdominal pain, nausea, and vomiting. He has not been able able to tolerate p.o. intake. Also coughing and having shortness of breath. Denies fevers or chills. He has not been vaccinated against Covid. Denies history of any medical problems including diabetes. No additional history could be obtained from him.  ED Course: Afebrile.  Tachycardic.  Not hypotensive or hypoxic.  WBC 8.4, hemoglobin 18.8, hematocrit 58.3, platelet 264.  Sodium 130, potassium 6.0, chloride 96, bicarb <7, BUN 27, creatinine 1.8, glucose 697, anion gap unmeasurable.  UA with ketones.  VBG with pH 7.0.  AST 39, ALT 45, alk phos 123, T bili 1.5.  Lipase 52.  SARS-CoV-2 PCR test positive.  Chest x-ray showing patchy bilateral airspace opacities in the mid-lower lung zone predominant distribution consistent with COVID-19 pneumonia.  Patient was started on insulin and IV fluid per DKA protocol.  Review of Systems:  All systems reviewed and apart from history of presenting illness, are negative.  History reviewed. No pertinent past medical history.  History reviewed. No pertinent surgical history.   reports that he has never smoked. He has never used smokeless tobacco. He reports previous alcohol use. He reports that he does not use drugs.  No Known Allergies  Family History  Problem Relation Age of Onset  . Healthy Mother   . Diabetes Father     Prior to Admission medications   Medication Sig Start Date End Date Taking? Authorizing Provider  lidocaine (LIDODERM) 5 % Place 1 patch onto the skin  daily. Remove & Discard patch within 12 hours or as directed by MD Patient not taking: Reported on 11/24/2019 07/30/19   Eustaquio Maize, PA-C  methocarbamol (ROBAXIN) 500 MG tablet Take 1 tablet (500 mg total) by mouth 2 (two) times daily. Patient not taking: Reported on 11/24/2019 07/30/19   Eustaquio Maize, PA-C  naproxen (NAPROSYN) 500 MG tablet Take 1 tablet (500 mg total) by mouth 2 (two) times daily. Patient not taking: Reported on 11/24/2019 07/30/19   Eustaquio Maize, PA-C  tiZANidine (ZANAFLEX) 4 MG tablet Take 1 tablet (4 mg total) by mouth every 8 (eight) hours as needed for muscle spasms. Patient not taking: Reported on 11/24/2019 05/16/19   Jaynee Eagles, PA-C    Physical Exam: Vitals:   11/24/19 2149 11/24/19 2200 11/25/19 0016 11/25/19 0022  BP: (!) 136/109 (!) 148/115 (!) 165/119   Pulse: (!) 125 (!) 110 (!) 128   Resp: 20 18 (!) 22   Temp:      TempSrc:      SpO2: 95% 96% 97%   Weight:    135.2 kg    Physical Exam Constitutional:      Appearance: He is ill-appearing.  HENT:     Head: Normocephalic and atraumatic.     Mouth/Throat:     Mouth: Mucous membranes are dry.  Eyes:     Extraocular Movements: Extraocular movements intact.     Conjunctiva/sclera: Conjunctivae normal.  Cardiovascular:     Rate and Rhythm: Regular rhythm. Tachycardia present.     Pulses: Normal pulses.     Comments: Tachycardic with heart  rate in the 130s Pulmonary:     Breath sounds: No wheezing or rales.     Comments: Tachypneic with respiratory rate in the 30s Abdominal:     General: Bowel sounds are normal. There is no distension.     Palpations: Abdomen is soft.     Tenderness: There is no abdominal tenderness. There is no guarding or rebound.  Musculoskeletal:        General: No swelling or tenderness.     Cervical back: Normal range of motion and neck supple.  Skin:    General: Skin is warm and dry.  Neurological:     General: No focal deficit present.     Mental Status: He is alert  and oriented to person, place, and time.     Labs on Admission: I have personally reviewed following labs and imaging studies  CBC: Recent Labs  Lab 11/22/19 2205 11/24/19 2109  WBC 4.3 8.4  HGB 16.8 18.8*  HCT 49.7 58.3*  MCV 86.3 91.8  PLT 230 016   Basic Metabolic Panel: Recent Labs  Lab 11/22/19 2205 11/24/19 2109  NA 133* 130*  K 4.7 6.0*  CL 96* 96*  CO2 20* <7*  GLUCOSE 460* 697*  BUN 12 27*  CREATININE 1.36* 1.87*  CALCIUM 10.2 9.6   GFR: CrCl cannot be calculated (Unknown ideal weight.). Liver Function Tests: Recent Labs  Lab 11/22/19 2205 11/24/19 2109  AST 33 39  ALT 48* 45*  ALKPHOS 122 123  BILITOT 1.2 1.5*  PROT 9.3* 10.3*  ALBUMIN 4.6 4.7   Recent Labs  Lab 11/22/19 2205 11/24/19 2109  LIPASE 27 52*   No results for input(s): AMMONIA in the last 168 hours. Coagulation Profile: No results for input(s): INR, PROTIME in the last 168 hours. Cardiac Enzymes: No results for input(s): CKTOTAL, CKMB, CKMBINDEX, TROPONINI in the last 168 hours. BNP (last 3 results) No results for input(s): PROBNP in the last 8760 hours. HbA1C: No results for input(s): HGBA1C in the last 72 hours. CBG: Recent Labs  Lab 11/24/19 2317  GLUCAP >600*   Lipid Profile: No results for input(s): CHOL, HDL, LDLCALC, TRIG, CHOLHDL, LDLDIRECT in the last 72 hours. Thyroid Function Tests: No results for input(s): TSH, T4TOTAL, FREET4, T3FREE, THYROIDAB in the last 72 hours. Anemia Panel: No results for input(s): VITAMINB12, FOLATE, FERRITIN, TIBC, IRON, RETICCTPCT in the last 72 hours. Urine analysis:    Component Value Date/Time   COLORURINE STRAW (A) 11/24/2019 1843   APPEARANCEUR CLEAR 11/24/2019 1843   LABSPEC 1.025 11/24/2019 1843   PHURINE 5.0 11/24/2019 1843   GLUCOSEU >=500 (A) 11/24/2019 1843   HGBUR MODERATE (A) 11/24/2019 1843   BILIRUBINUR NEGATIVE 11/24/2019 1843   KETONESUR 80 (A) 11/24/2019 1843   PROTEINUR >=300 (A) 11/24/2019 1843   NITRITE  NEGATIVE 11/24/2019 1843   LEUKOCYTESUR NEGATIVE 11/24/2019 1843    Radiological Exams on Admission: DG Chest Portable 1 View  Result Date: 11/24/2019 CLINICAL DATA:  Shortness of breath, nausea, COVID. EXAM: PORTABLE CHEST 1 VIEW COMPARISON:  None. FINDINGS: Low lung volumes. Patchy heterogeneous bilateral airspace opacities in a mid-lower lung zone predominant distribution, right greater than left. Heart is normal in size with normal mediastinal contours. No pleural effusion. No pneumothorax or evidence of pneumomediastinum. No acute osseous abnormalities are seen. IMPRESSION: Patchy bilateral airspace opacities in a mid-lower lung zone predominant distribution, pattern consistent with COVID-19 pneumonia. Electronically Signed   By: Keith Rake M.D.   On: 11/24/2019 23:08    EKG:  Independently reviewed.  Sinus tachycardia, probable early repolarization abnormality.  T wave abnormality in lead III similar to prior tracing.  T wave amplitude increased since prior tracing.  Assessment/Plan Principal Problem:   DKA (diabetic ketoacidoses) (HCC) Active Problems:   Diabetes ([place])   Pneumonia due to COVID-19 virus   Nausea and vomiting   Polycythemia   New onset diabetes/severe DKA: No documented history of diabetes.  Blood glucose significantly elevated at 697.  Bicarb <7, anion gap unmeasurable on labs.  UA with ketones.  pH low at 7.0 on VBG. -Keep n.p.o. Continue IV insulin.  Continue IV fluid hydration with normal saline, when CBG less than 250, switch to D5-1/2 normal saline.  Start bicarb drip.  Frequent CBG checks per DKA protocol.  Monitor BMP every 4 hours.  Check beta hydroxybutyric acid level.  Check A1c.  When DKA resolves, initiate diet and subcutaneous insulin.  Continue IV insulin for an additional 1 to 2 hours.  Consult diabetes coordinator.  COVID-19 viral pneumonia: SARS-CoV-2 PCR test positive. Chest x-ray showing patchy bilateral airspace opacities in the mid-lower lung  zone predominant distribution consistent with COVID-19 pneumonia.  Currently not hypoxic. -Remdesivir -IV Decadron 6 mg daily -Vitamin C, zinc, vitamin D -Antitussives as needed -Tylenol as needed -Bronchodilator as needed -Check inflammatory markers including ferritin, fibrinogen, D-dimer, CRP, LDH -Check procalcitonin level -Daily CBC with differential, CMP, CRP, D-dimer, LDH -Airborne and contact precautions -Incentive spirometry, flutter valve -Encourage prone positioning -Continuous pulse ox -Supplemental oxygen as needed to keep oxygen saturation above 90% -Blood culture x2 ordered  Nausea/emesis: Likely multifactorial in the setting of DKA and COVID-19 viral infection.  No significant elevation of lipase and LFTs.  Abdominal exam benign. -Symptomatic management: Antiemetic as needed  Polycythemia: Hemoglobin 18.8, hematocrit 58.3.  Hemoglobin was 16.8 on labs done 11/22/2019. -?Hemoconcentration, repeat CBC in a.m.  AKI: Likely prerenal from dehydration.  BUN 27, creatinine 1.8.  Creatinine was 1.3 on labs done 11/22/2019. -IV fluid hydration.  Monitor renal function and urine output.  Avoid nephrotoxic agents.  Hyperkalemia: Likely due to AKI.  Potassium 6.0.  T waves appear more peaked on EKG compared to prior tracing. -Cardiac monitoring.  Give calcium gluconate.  Continue insulin and bicarb infusion.  Monitor BMP every 4 hours.  DVT prophylaxis: Subcutaneous heparin Addendum: Lovenox ordered for DVT prophylaxis Code Status: Full code Family Communication: No family available at this time. Disposition Plan: Status is: Inpatient  Remains inpatient appropriate because:IV treatments appropriate due to intensity of illness or inability to take PO and Inpatient level of care appropriate due to severity of illness   Dispo: The patient is from: Home              Anticipated d/c is to: Home              Anticipated d/c date is: > 3 days              Patient currently is not  medically stable to d/c.  The medical decision making on this patient was of high complexity and the patient is at high risk for clinical deterioration, therefore this is a level 3 visit.  Shela Leff MD [facility]  If 7PM-7AM, please contact night-coverage [email or web address]  11/25/2019, 12:36 AM

## 2019-11-25 NOTE — ED Notes (Signed)
I gave patient a cup of chicken broth per nurse

## 2019-11-25 NOTE — Progress Notes (Signed)
PROGRESS NOTE    Jacob Esparza  DDU:[id number] DOB: [birth year] DOA: 11/24/2019 PCP: System, Provider Not In    Brief Narrative:  19 year old male with no significant medical history, history of morbid obesity, mother reported prediabetes but levels unknown presenting to the emergency department with abdominal pain, nausea and vomiting for the last 2 days.  Not able to tolerate any oral intake.  Also coughing and having shortness of breath.  Denies fever or chills.  Unvaccinated against COVID-19. In the emergency room, afebrile.  Tachycardic.  Blood pressure stable.  On room air.  Hemoconcentrated.  Bicarb less than 7, BUN 27, creatinine 1.8 glucose 697 and anion gap unmeasurable.  COVID-19 positive.  Lipase 52.  Chest x-ray with patchy bilateral airspace opacities.  Started on insulin drip and admitted.   Assessment & Plan:   Principal Problem:   DKA (diabetic ketoacidoses) (HCC) Active Problems:   Diabetes (HCC)   Pneumonia due to COVID-19 virus   Nausea and vomiting   Polycythemia  DKA, new onset diabetes: Continue monitoring in the hospital given severity of symptoms. His nausea is better, will start patient on clears and noncarb diet. Continue insulin drip until anion gap closes and patient is able to eat good meal. Blood sugars every hour until on IV insulin. BMP every 4 hours until on IV insulin.  Keep potassium 3.5-5, supplement as per protocol. Check magnesium and phosphorus in the morning. Bolus IV fluids given in the ER.  We'll keep on maintenance IV fluids. Discontinue bicarbonate fluid. Keep on isotonic saline until blood sugars more than 250. When blood sugars less than 250, changed to 5% dextrose and continue insulin regimen. Will transition to subcu insulin once patient is able to take by mouth as well anion gap is closed. Hemoglobin A1c 10.7. Patient will need insulin regimen on discharge.  Acute kidney injury with hyperkalemia: Due to above.  Treated with IV  fluids and improving.  COVID-19 pneumonia: On room air. Will avoid steroid as patient is on room air and currently treated for DKA. Continue remdesivir, day 2/5. Supportive treatment with breathing treatment, chest physiotherapy and mobility.  Morbid obesity: Patient will definitely benefit with weight loss and exercise.  We'll continue to encourage.    DVT prophylaxis: Lovenox subcu   Code Status: Full code Family Communication: Mother on the phone Disposition Plan: Status is: Inpatient  Remains inpatient appropriate because:Persistent severe electrolyte disturbances and Inpatient level of care appropriate due to severity of illness   Dispo: The patient is from: Home              Anticipated d/c is to: Home              Anticipated d/c date is: 2 days              Patient currently is not medically stable to d/c.         Consultants:   None  Procedures:   None  Antimicrobials:  Antibiotics Given (last 72 hours)    Date/Time Action Medication Dose Rate   11/25/19 0134 New Bag/Given   remdesivir 200 mg in sodium chloride 0.9% 250 mL IVPB 200 mg 580 mL/hr         Subjective: Patient seen and examined.  He still in the ER.  Denied any active nausea vomiting.  Wants to try some liquids.  No other overnight events.  He stays with eye closed, does not interact much.  Objective: Vitals:   11/25/19 2376 11/25/19 0827  11/25/19 0839 11/25/19 1148  BP: (!) 139/99 (!) 169/116 (!) 177/111 140/90  Pulse: (!) 125 (!) 121 (!) 121 (!) 108  Resp: 14 19  19   Temp:  98.2 F (36.8 C) 98.2 F (36.8 C)   TempSrc:  Oral Oral   SpO2: 98% 98%  100%  Weight:        Intake/Output Summary (Last 24 hours) at 11/25/2019 1634 Last data filed at 11/25/2019 0442 Gross per 24 hour  Intake 4004 ml  Output 2360 ml  Net 1644 ml   Filed Weights   11/25/19 0022  Weight: 135.2 kg    Examination:  General exam: Appears comfortable.  Not in any acute distress.  He is morbidly  obese. Respiratory system: Clear to auscultation. Respiratory effort normal.  No added sound. Cardiovascular system: S1 & S2 heard, RRR. No JVD, murmurs, rubs, gallops or clicks. No pedal edema. Gastrointestinal system: Abdomen is nondistended, soft and nontender. No organomegaly or masses felt. Normal bowel sounds heard.  No localized tenderness. Central nervous system: Alert and oriented. No focal neurological deficits. Extremities: Symmetric 5 x 5 power. Skin: No rashes, lesions or ulcers Psychiatry: Judgement and insight appear normal. Mood & affect appropriate.     Data Reviewed: I have personally reviewed following labs and imaging studies  CBC: Recent Labs  Lab 11/22/19 2205 11/24/19 2109 11/25/19 0450  WBC 4.3 8.4 10.6*  HGB 16.8 18.8* 17.2*  HCT 49.7 58.3* 51.6  MCV 86.3 91.8 89.0  PLT 230 264 228   Basic Metabolic Panel: Recent Labs  Lab 11/22/19 2205 11/24/19 2109 11/25/19 0039 11/25/19 0450 11/25/19 1037  NA 133* 130* 134* 140 139  K 4.7 6.0* 5.5* 5.6* 4.4  CL 96* 96* 98 106 108  CO2 20* <7* 7* 9* 12*  GLUCOSE 460* 697* 609* 439* 273*  BUN 12 27* 29* 26* 22*  CREATININE 1.36* 1.87* 1.73* 1.67* 1.48*  CALCIUM 10.2 9.6 9.2 9.4 8.8*   GFR: CrCl cannot be calculated (Unknown ideal weight.). Liver Function Tests: Recent Labs  Lab 11/22/19 2205 11/24/19 2109  AST 33 39  ALT 48* 45*  ALKPHOS 122 123  BILITOT 1.2 1.5*  PROT 9.3* 10.3*  ALBUMIN 4.6 4.7   Recent Labs  Lab 11/22/19 2205 11/24/19 2109  LIPASE 27 52*   No results for input(s): AMMONIA in the last 168 hours. Coagulation Profile: No results for input(s): INR, PROTIME in the last 168 hours. Cardiac Enzymes: No results for input(s): CKTOTAL, CKMB, CKMBINDEX, TROPONINI in the last 168 hours. BNP (last 3 results) No results for input(s): PROBNP in the last 8760 hours. HbA1C: Recent Labs    11/25/19 0039  HGBA1C 10.7*   CBG: Recent Labs  Lab 11/25/19 1031 11/25/19 1150  11/25/19 1302 11/25/19 1437 11/25/19 1553  GLUCAP 284* 263* 223* 207* 245*   Lipid Profile: No results for input(s): CHOL, HDL, LDLCALC, TRIG, CHOLHDL, LDLDIRECT in the last 72 hours. Thyroid Function Tests: No results for input(s): TSH, T4TOTAL, FREET4, T3FREE, THYROIDAB in the last 72 hours. Anemia Panel: Recent Labs    11/25/19 0042  FERRITIN 1,155*   Sepsis Labs: Recent Labs  Lab 11/25/19 0039  PROCALCITON 0.77    Recent Results (from the past 240 hour(s))  SARS Coronavirus 2 by RT PCR (hospital order, performed in [facility] hospital lab) Nasopharyngeal Nasopharyngeal Swab     Status: Abnormal   Collection Time: 11/24/19  6:44 PM   Specimen: Nasopharyngeal Swab  Result Value Ref Range Status   SARS  Coronavirus 2 POSITIVE (A) NEGATIVE Final    Comment: RESULT CALLED TO, READ BACK BY AND VERIFIED WITH: ILENE HODGES @ 2111 ON 11/24/19 C VARNER (NOTE) SARS-CoV-2 target nucleic acids are DETECTED  SARS-CoV-2 RNA is generally detectable in upper respiratory specimens  during the acute phase of infection.  Positive results are indicative  of the presence of the identified virus, but do not rule out bacterial infection or co-infection with other pathogens not detected by the test.  Clinical correlation with patient history and  other diagnostic information is necessary to determine patient infection status.  The expected result is negative.  Fact Sheet for Patients:   BoilerBrush.com.cy   Fact Sheet for Healthcare Providers:   https://pope.com/    This test is not yet approved or cleared by the [place] FDA and  has been authorized for detection and/or diagnosis of SARS-CoV-2 by FDA under an Emergency Use Authorization (EUA).  This EUA will remain in effect (meaning thi s test can be used) for the duration of  the COVID-19 declaration under Section 564(b)(1) of the Act, 21 U.S.C. section 360-bbb-3(b)(1), unless the  authorization is terminated or revoked sooner.  Performed at [facility], [address], [place], [place] [address]          Radiology Studies: DG Chest Portable 1 View  Result Date: 11/24/2019 CLINICAL DATA:  Shortness of breath, nausea, COVID. EXAM: PORTABLE CHEST 1 VIEW COMPARISON:  None. FINDINGS: Low lung volumes. Patchy heterogeneous bilateral airspace opacities in a mid-lower lung zone predominant distribution, right greater than left. Heart is normal in size with normal mediastinal contours. No pleural effusion. No pneumothorax or evidence of pneumomediastinum. No acute osseous abnormalities are seen. IMPRESSION: Patchy bilateral airspace opacities in a mid-lower lung zone predominant distribution, pattern consistent with COVID-19 pneumonia. Electronically Signed   By: Narda Rutherford M.D.   On: 11/24/2019 23:08        Scheduled Meds: . vitamin C  500 mg Oral Daily  . cholecalciferol  1,000 Units Oral Daily  . enoxaparin (LOVENOX) injection  70 mg Subcutaneous QHS  . living well with diabetes book   Does not apply Once  . zinc sulfate  220 mg Oral Daily   Continuous Infusions: . sodium chloride Stopped (11/25/19 1500)  . dextrose 5 % and 0.45% NaCl 125 mL/hr at 11/25/19 1500  . insulin 11.5 Units/hr (11/25/19 0855)  . remdesivir 100 mg in NS 100 mL 100 mg (11/25/19 0922)  .  sodium bicarbonate (isotonic) infusion in sterile water 125 mL/hr at 11/25/19 1455     LOS: 1 day    Time spent: Additional 30 minutes.    Dorcas Carrow, MD [facility] Pager [phone]

## 2019-11-26 ENCOUNTER — Other Ambulatory Visit: Payer: Self-pay

## 2019-11-26 LAB — GLUCOSE, CAPILLARY
Glucose-Capillary: 208 mg/dL — ABNORMAL HIGH (ref 70–99)
Glucose-Capillary: 212 mg/dL — ABNORMAL HIGH (ref 70–99)
Glucose-Capillary: 218 mg/dL — ABNORMAL HIGH (ref 70–99)
Glucose-Capillary: 219 mg/dL — ABNORMAL HIGH (ref 70–99)
Glucose-Capillary: 219 mg/dL — ABNORMAL HIGH (ref 70–99)
Glucose-Capillary: 225 mg/dL — ABNORMAL HIGH (ref 70–99)
Glucose-Capillary: 228 mg/dL — ABNORMAL HIGH (ref 70–99)
Glucose-Capillary: 228 mg/dL — ABNORMAL HIGH (ref 70–99)
Glucose-Capillary: 234 mg/dL — ABNORMAL HIGH (ref 70–99)
Glucose-Capillary: 234 mg/dL — ABNORMAL HIGH (ref 70–99)
Glucose-Capillary: 243 mg/dL — ABNORMAL HIGH (ref 70–99)
Glucose-Capillary: 279 mg/dL — ABNORMAL HIGH (ref 70–99)
Glucose-Capillary: 320 mg/dL — ABNORMAL HIGH (ref 70–99)
Glucose-Capillary: 321 mg/dL — ABNORMAL HIGH (ref 70–99)

## 2019-11-26 LAB — CBC WITH DIFFERENTIAL/PLATELET
Abs Immature Granulocytes: 0.02 10*3/uL (ref 0.00–0.07)
Basophils Absolute: 0 10*3/uL (ref 0.0–0.1)
Basophils Relative: 0 %
Eosinophils Absolute: 0 10*3/uL (ref 0.0–0.5)
Eosinophils Relative: 0 %
HCT: 41.1 % (ref 39.0–52.0)
Hemoglobin: 14.1 g/dL (ref 13.0–17.0)
Immature Granulocytes: 0 %
Lymphocytes Relative: 17 %
Lymphs Abs: 1.2 10*3/uL (ref 0.7–4.0)
MCH: 29.7 pg (ref 26.0–34.0)
MCHC: 34.3 g/dL (ref 30.0–36.0)
MCV: 86.7 fL (ref 80.0–100.0)
Monocytes Absolute: 0.5 10*3/uL (ref 0.1–1.0)
Monocytes Relative: 7 %
Neutro Abs: 5.2 10*3/uL (ref 1.7–7.7)
Neutrophils Relative %: 76 %
Platelets: 178 10*3/uL (ref 150–400)
RBC: 4.74 MIL/uL (ref 4.22–5.81)
RDW: 13.9 % (ref 11.5–15.5)
WBC: 6.9 10*3/uL (ref 4.0–10.5)
nRBC: 0 % (ref 0.0–0.2)

## 2019-11-26 LAB — COMPREHENSIVE METABOLIC PANEL
ALT: 25 U/L (ref 0–44)
AST: 19 U/L (ref 15–41)
Albumin: 3.5 g/dL (ref 3.5–5.0)
Alkaline Phosphatase: 74 U/L (ref 38–126)
Anion gap: 17 — ABNORMAL HIGH (ref 5–15)
BUN: 13 mg/dL (ref 6–20)
CO2: 21 mmol/L — ABNORMAL LOW (ref 22–32)
Calcium: 8.3 mg/dL — ABNORMAL LOW (ref 8.9–10.3)
Chloride: 101 mmol/L (ref 98–111)
Creatinine, Ser: 0.96 mg/dL (ref 0.61–1.24)
GFR calc Af Amer: >60 mL/min (ref >60)
GFR calc non Af Amer: >60 mL/min (ref >60)
Glucose, Bld: 243 mg/dL — ABNORMAL HIGH (ref 70–99)
Potassium: 2.9 mmol/L — ABNORMAL LOW (ref 3.5–5.1)
Sodium: 139 mmol/L (ref 135–145)
Total Bilirubin: 0.8 mg/dL (ref 0.3–1.2)
Total Protein: 6.9 g/dL (ref 6.5–8.1)

## 2019-11-26 LAB — BASIC METABOLIC PANEL
Anion gap: 14 (ref 5–15)
BUN: 13 mg/dL (ref 6–20)
CO2: 20 mmol/L — ABNORMAL LOW (ref 22–32)
Calcium: 8.3 mg/dL — ABNORMAL LOW (ref 8.9–10.3)
Chloride: 102 mmol/L (ref 98–111)
Creatinine, Ser: 0.91 mg/dL (ref 0.61–1.24)
GFR calc Af Amer: >60 mL/min (ref >60)
GFR calc non Af Amer: >60 mL/min (ref >60)
Glucose, Bld: 245 mg/dL — ABNORMAL HIGH (ref 70–99)
Potassium: 3.8 mmol/L (ref 3.5–5.1)
Sodium: 136 mmol/L (ref 135–145)

## 2019-11-26 LAB — MAGNESIUM: Magnesium: 2.1 mg/dL (ref 1.7–2.4)

## 2019-11-26 LAB — PHOSPHORUS: Phosphorus: 1.5 mg/dL — ABNORMAL LOW (ref 2.5–4.6)

## 2019-11-26 LAB — C-REACTIVE PROTEIN: CRP: 1.9 mg/dL — ABNORMAL HIGH (ref <1.0)

## 2019-11-26 LAB — D-DIMER, QUANTITATIVE: D-Dimer, Quant: 0.76 ug/mL-FEU — ABNORMAL HIGH (ref 0.00–0.50)

## 2019-11-26 MED ORDER — MAGNESIUM SULFATE 2 GM/50ML IV SOLN
2.0000 g | Freq: Once | INTRAVENOUS | Status: AC
  Administered 2019-11-26: 2 g via INTRAVENOUS
  Filled 2019-11-26: qty 50

## 2019-11-26 MED ORDER — INSULIN ASPART 100 UNIT/ML ~~LOC~~ SOLN
10.0000 [IU] | Freq: Three times a day (TID) | SUBCUTANEOUS | Status: DC
  Administered 2019-11-26 – 2019-11-28 (×6): 10 [IU] via SUBCUTANEOUS

## 2019-11-26 MED ORDER — POTASSIUM CHLORIDE 10 MEQ/100ML IV SOLN
10.0000 meq | INTRAVENOUS | Status: AC
  Administered 2019-11-26 (×3): 10 meq via INTRAVENOUS
  Filled 2019-11-26 (×3): qty 100

## 2019-11-26 MED ORDER — INSULIN ASPART 100 UNIT/ML ~~LOC~~ SOLN
0.0000 [IU] | SUBCUTANEOUS | Status: DC
  Administered 2019-11-26: 5 [IU] via SUBCUTANEOUS
  Administered 2019-11-26: 11 [IU] via SUBCUTANEOUS
  Administered 2019-11-26: 5 [IU] via SUBCUTANEOUS
  Administered 2019-11-27: 8 [IU] via SUBCUTANEOUS
  Administered 2019-11-27 (×2): 5 [IU] via SUBCUTANEOUS
  Administered 2019-11-27: 11 [IU] via SUBCUTANEOUS
  Administered 2019-11-27 (×3): 8 [IU] via SUBCUTANEOUS
  Administered 2019-11-28: 11 [IU] via SUBCUTANEOUS
  Administered 2019-11-28: 5 [IU] via SUBCUTANEOUS
  Administered 2019-11-28: 11 [IU] via SUBCUTANEOUS
  Administered 2019-11-28: 3 [IU] via SUBCUTANEOUS

## 2019-11-26 MED ORDER — INSULIN DETEMIR 100 UNIT/ML ~~LOC~~ SOLN
20.0000 [IU] | Freq: Two times a day (BID) | SUBCUTANEOUS | Status: DC
  Administered 2019-11-26 – 2019-11-27 (×3): 20 [IU] via SUBCUTANEOUS
  Filled 2019-11-26 (×3): qty 0.2

## 2019-11-26 MED ORDER — K PHOS MONO-SOD PHOS DI & MONO 155-852-130 MG PO TABS
250.0000 mg | ORAL_TABLET | Freq: Two times a day (BID) | ORAL | Status: AC
  Administered 2019-11-26 (×2): 250 mg via ORAL
  Filled 2019-11-26 (×2): qty 1

## 2019-11-26 MED ORDER — POTASSIUM CHLORIDE 20 MEQ PO PACK
40.0000 meq | PACK | Freq: Two times a day (BID) | ORAL | Status: DC
  Administered 2019-11-26 (×2): 40 meq via ORAL
  Filled 2019-11-26 (×3): qty 2

## 2019-11-26 NOTE — Progress Notes (Signed)
PROGRESS NOTE    Jacob ReddishSherard R Esparza  ZOX:[id number] DOB: 07/06/2000 DOA: 11/24/2019 PCP: System, Provider Not In    Brief Narrative:  19 year old male with no significant medical history, history of morbid obesity, mother reported prediabetes but levels unknown presenting to the emergency department with abdominal pain, nausea and vomiting for the last 2 days.  Not able to tolerate any oral intake.  Also coughing and having shortness of breath.  Denies fever or chills.  Unvaccinated against COVID-19.  In the emergency room, afebrile.  Tachycardic.  Blood pressure stable.  On room air.  Hemoconcentrated.  Bicarb less than 7, BUN 27, creatinine 1.8 glucose 697 and anion gap unmeasurable.  COVID-19 positive.  Lipase 52.  Chest x-ray with patchy bilateral airspace opacities.  Started on insulin drip and admitted.   Assessment & Plan:   Principal Problem:   DKA (diabetic ketoacidoses) (HCC) Active Problems:   Diabetes (HCC)   Pneumonia due to COVID-19 virus   Nausea and vomiting   Polycythemia  DKA, new onset diabetes: Clinically improving.  Anion gap closed.  A1c 10.7.   Able to eat diet today. Stop insulin drip. Will start on subcu insulin with twice a day long-acting insulin, prandial insulin and sliding scale insulin.  Will monitor 24 hours to decide about home regimen. Aggressive replacement of electrolytes. Continue normal saline today.  Hypokalemia/hypophosphatemia/hypomagnesemia: Replace aggressively by mouth and IV.  Recheck levels tomorrow morning.  Acute kidney injury with hyperkalemia: Due to above.  Treated with IV fluids and improving.  COVID-19 pneumonia: On room air. Will avoid steroid as patient is on room air and currently treated for DKA. Continue remdesivir, day 3/5. Supportive treatment with breathing treatment, chest physiotherapy and mobility.  Morbid obesity: Patient will definitely benefit with weight loss and exercise.  We'll continue to  encourage.  Ambulate, mobilize.  Transition to subcu insulin.  Transfer to MedSurg bed.  DVT prophylaxis: Lovenox subcu   Code Status: Full code Family Communication: Mother on the phone Disposition Plan: Status is: Inpatient  Remains inpatient appropriate because:Persistent severe electrolyte disturbances and Inpatient level of care appropriate due to severity of illness   Dispo: The patient is from: Home              Anticipated d/c is to: Home              Anticipated d/c date is: 1 to 2 days.              Patient currently is not medically stable to d/c.         Consultants:   None  Procedures:   None  Antimicrobials:  Antibiotics Given (last 72 hours)    Date/Time Action Medication Dose Rate   11/25/19 0134 New Bag/Given   remdesivir 200 mg in sodium chloride 0.9% 250 mL IVPB 200 mg 580 mL/hr   11/26/19 1043 New Bag/Given   remdesivir 100 mg in sodium chloride 0.9 % 100 mL IVPB 100 mg 200 mL/hr         Subjective: Patient seen and examined.  Feels better.  No nausea or vomiting.  He is not very upfront with discussion. Ready to eat. He is convinced that he will go home on insulin.  Discussed with his mother and she is familiar with use of insulin at home and there are multiple family members using insulin.  Objective: Vitals:   11/26/19 0800 11/26/19 0809 11/26/19 0900 11/26/19 1000  BP: 130/87  (!) 147/86 (!) 109/43  Pulse:  98  91 (!) 102  Resp: 11  15 18   Temp: 97.7 F (36.5 C)     TempSrc: Oral     SpO2: 100%  98% 100%  Weight:      Height:  5\' 6"  (1.676 m)      Intake/Output Summary (Last 24 hours) at 11/26/2019 1128 Last data filed at 11/26/2019 1000 Gross per 24 hour  Intake 6701.72 ml  Output 1700 ml  Net 5001.72 ml   Filed Weights   11/25/19 0022  Weight: 135.2 kg    Examination:  General exam: Appears comfortable.  Not in any acute distress.  He is morbidly obese. Respiratory system: Clear to auscultation. Respiratory effort  normal.  No added sound. Cardiovascular system: S1 & S2 heard, RRR. No JVD, murmurs, rubs, gallops or clicks. No pedal edema. Gastrointestinal system: Abdomen is nondistended, soft and nontender. No organomegaly or masses felt. Normal bowel sounds heard.  No localized tenderness. Central nervous system: Alert and oriented. No focal neurological deficits. Extremities: Symmetric 5 x 5 power. Skin: No rashes, lesions or ulcers Psychiatry: Judgement and insight appear normal. Mood & affect appropriate.     Data Reviewed: I have personally reviewed following labs and imaging studies  CBC: Recent Labs  Lab 11/22/19 2205 11/24/19 2109 11/25/19 0450 11/26/19 0251  WBC 4.3 8.4 10.6* 6.9  NEUTROABS  --   --   --  5.2  HGB 16.8 18.8* 17.2* 14.1  HCT 49.7 58.3* 51.6 41.1  MCV 86.3 91.8 89.0 86.7  PLT 230 264 228 178   Basic Metabolic Panel: Recent Labs  Lab 11/25/19 1037 11/25/19 1630 11/25/19 2042 11/26/19 0251 11/26/19 1006  NA 139 135 136 139 136  K 4.4 4.0 3.6 2.9* 3.8  CL 108 104 105 101 102  CO2 12* 15* 15* 21* 20*  GLUCOSE 273* 263* 247* 243* 245*  BUN 22* 18 16 13 13   CREATININE 1.48* 1.15 0.93 0.96 0.91  CALCIUM 8.8* 8.5* 8.7* 8.3* 8.3*  MG  --   --   --  2.1  --   PHOS  --   --   --  1.5*  --    GFR: Estimated Creatinine Clearance: 169.2 mL/min (by C-G formula based on SCr of 0.91 mg/dL). Liver Function Tests: Recent Labs  Lab 11/22/19 2205 11/24/19 2109 11/26/19 0251  AST 33 39 19  ALT 48* 45* 25  ALKPHOS 122 123 74  BILITOT 1.2 1.5* 0.8  PROT 9.3* 10.3* 6.9  ALBUMIN 4.6 4.7 3.5   Recent Labs  Lab 11/22/19 2205 11/24/19 2109  LIPASE 27 52*   No results for input(s): AMMONIA in the last 168 hours. Coagulation Profile: No results for input(s): INR, PROTIME in the last 168 hours. Cardiac Enzymes: No results for input(s): CKTOTAL, CKMB, CKMBINDEX, TROPONINI in the last 168 hours. BNP (last 3 results) No results for input(s): PROBNP in the last 8760  hours. HbA1C: Recent Labs    11/25/19 0039  HGBA1C 10.7*   CBG: Recent Labs  Lab 11/26/19 0641 11/26/19 0748 11/26/19 0921 11/26/19 1029 11/26/19 1126  GLUCAP 218* 212* 208* 228* 321*   Lipid Profile: No results for input(s): CHOL, HDL, LDLCALC, TRIG, CHOLHDL, LDLDIRECT in the last 72 hours. Thyroid Function Tests: No results for input(s): TSH, T4TOTAL, FREET4, T3FREE, THYROIDAB in the last 72 hours. Anemia Panel: Recent Labs    11/25/19 0042  FERRITIN 1,155*   Sepsis Labs: Recent Labs  Lab 11/25/19 0039  PROCALCITON 0.77    Recent  Results (from the past 240 hour(s))  SARS Coronavirus 2 by RT PCR (hospital order, performed in [facility] hospital lab) Nasopharyngeal Nasopharyngeal Swab     Status: Abnormal   Collection Time: 11/24/19  6:44 PM   Specimen: Nasopharyngeal Swab  Result Value Ref Range Status   SARS Coronavirus 2 POSITIVE (A) NEGATIVE Final    Comment: RESULT CALLED TO, READ BACK BY AND VERIFIED WITH: ILENE HODGES @ 2111 ON 11/24/19 C VARNER (NOTE) SARS-CoV-2 target nucleic acids are DETECTED  SARS-CoV-2 RNA is generally detectable in upper respiratory specimens  during the acute phase of infection.  Positive results are indicative  of the presence of the identified virus, but do not rule out bacterial infection or co-infection with other pathogens not detected by the test.  Clinical correlation with patient history and  other diagnostic information is necessary to determine patient infection status.  The expected result is negative.  Fact Sheet for Patients:   BoilerBrush.com.cy   Fact Sheet for Healthcare Providers:   https://pope.com/    This test is not yet approved or cleared by the [place] FDA and  has been authorized for detection and/or diagnosis of SARS-CoV-2 by FDA under an Emergency Use Authorization (EUA).  This EUA will remain in effect (meaning thi s test can be used) for the  duration of  the COVID-19 declaration under Section 564(b)(1) of the Act, 21 U.S.C. section 360-bbb-3(b)(1), unless the authorization is terminated or revoked sooner.  Performed at [facility], [address], [place], [place] [address]   Culture, blood (routine x 2)     Status: None (Preliminary result)   Collection Time: 11/25/19 12:44 AM   Specimen: BLOOD  Result Value Ref Range Status   Specimen Description   Final    BLOOD BLOOD RIGHT ARM Performed at [facility], [address] [place], [place] [address]    Special Requests   Final    BOTTLES DRAWN AEROBIC AND ANAEROBIC Blood Culture adequate volume Performed at [facility], [address] [place], [place] [address]    Culture   Final    NO GROWTH 1 DAY Performed at [facility], [address] [place], [place] [address]    Report Status PENDING  Incomplete  MRSA PCR Screening     Status: None   Collection Time: 11/25/19  5:31 PM   Specimen: Nasal Mucosa; Nasopharyngeal  Result Value Ref Range Status   MRSA by PCR NEGATIVE NEGATIVE Final    Comment:        The GeneXpert MRSA Assay (FDA approved for NASAL specimens only), is one component of a comprehensive MRSA colonization surveillance program. It is not intended to diagnose MRSA infection nor to guide or monitor treatment for MRSA infections. Performed at [facility], [address] [place], [place] [address]          Radiology Studies: DG Chest Portable 1 View  Result Date: 11/24/2019 CLINICAL DATA:  Shortness of breath, nausea, COVID. EXAM: PORTABLE CHEST 1 VIEW COMPARISON:  None. FINDINGS: Low lung volumes. Patchy heterogeneous bilateral airspace opacities in a mid-lower lung zone predominant distribution, right greater than left. Heart is normal in size with normal mediastinal contours. No pleural effusion. No pneumothorax or evidence of pneumomediastinum. No acute  osseous abnormalities are seen. IMPRESSION: Patchy bilateral airspace opacities in a mid-lower lung zone predominant distribution, pattern consistent with COVID-19 pneumonia. Electronically Signed   By: Ivette Loyal.D.  On: 11/24/2019 23:08        Scheduled Meds: . vitamin C  500 mg Oral Daily  . Chlorhexidine Gluconate Cloth  6 each Topical Daily  . cholecalciferol  1,000 Units Oral Daily  . enoxaparin (LOVENOX) injection  70 mg Subcutaneous QHS  . insulin aspart  0-15 Units Subcutaneous Q4H  . insulin aspart  10 Units Subcutaneous TID WC  . insulin detemir  20 Units Subcutaneous BID  . living well with diabetes book   Does not apply Once  . phosphorus  250 mg Oral BID  . potassium chloride  40 mEq Oral BID  . zinc sulfate  220 mg Oral Daily   Continuous Infusions: . sodium chloride Stopped (11/25/19 2040)  . insulin 7 mL/hr at 11/26/19 1000  . remdesivir 100 mg in NS 100 mL 100 mg (11/26/19 1043)     LOS: 2 days    Time spent: 35 minutes.    Dorcas Carrow, MD [facility] Pager [phone]

## 2019-11-26 NOTE — TOC Initial Note (Signed)
Transition of Care [facility]) - Initial/Assessment Note    Patient Details  Name: Jacob Esparza MRN: [id number] Date of Birth: 04/28/2000  Transition of Care [facility] CM/SW Contact:    Golda Acre, RN Phone Number: 11/26/2019, 8:22 AM  Clinical Narrative:                 dka and covid pna' Iv insullin, mg2gm x1 iv, remdesivir, Unvaccinated male Plan: follow for progression and toc needs.  Expected Discharge Plan: Home/Self Care Barriers to Discharge: Continued Medical Work up   Patient Goals and CMS Choice Patient states their goals for this hospitalization and ongoing recovery are:: to go back home CMS Medicare.gov Compare Post Acute Care list provided to:: Patient    Expected Discharge Plan and Services Expected Discharge Plan: Home/Self Care   Discharge Planning Services: CM Consult   Living arrangements for the past 2 months: Apartment                                      Prior Living Arrangements/Services Living arrangements for the past 2 months: Apartment Lives with:: Self Patient language and need for interpreter reviewed:: Yes Do you feel safe going back to the place where you live?: Yes      Need for Family Participation in Patient Care: Yes (Comment) Care giver support system in place?: Yes (comment)   Criminal Activity/Legal Involvement Pertinent to Current Situation/Hospitalization: No - Comment as needed  Activities of Daily Living Home Assistive Devices/Equipment: None ADL Screening (condition at time of admission) Patient's cognitive ability adequate to safely complete daily activities?: Yes Is the patient deaf or have difficulty hearing?: No Does the patient have difficulty seeing, even when wearing glasses/contacts?: No Does the patient have difficulty concentrating, remembering, or making decisions?: No Patient able to express need for assistance with ADLs?: Yes Does the patient have difficulty dressing or bathing?: No Independently  performs ADLs?: Yes (appropriate for developmental age) Does the patient have difficulty walking or climbing stairs?: Yes (secondary to weakness) Weakness of Legs: Both Weakness of Arms/Hands: None  Permission Sought/Granted                  Emotional Assessment Appearance:: Appears stated age Attitude/Demeanor/Rapport: Engaged Affect (typically observed): Calm Orientation: : Oriented to Self, Oriented to Place, Oriented to  Time, Oriented to Situation Alcohol / Substance Use: Not Applicable Psych Involvement: No (comment)  Admission diagnosis:  Hyperkalemia [E87.5] DKA (diabetic ketoacidoses) (HCC) [E11.10] Diabetic ketoacidosis without coma associated with diabetes mellitus due to underlying condition (HCC) [E08.10] COVID-19 [U07.1] Patient Active Problem List   Diagnosis Date Noted  . Diabetes (HCC) 11/25/2019  . Pneumonia due to COVID-19 virus 11/25/2019  . Nausea and vomiting 11/25/2019  . Polycythemia 11/25/2019  . DKA (diabetic ketoacidoses) (HCC) 11/24/2019   PCP:  System, Provider Not In Pharmacy:   [facility] [phone] [place], [place] - [phone] AT [facility] [address] [place] [address] Phone: [phone] Fax: [phone]     Social Determinants of Health (SDOH) Interventions    Readmission Risk Interventions No flowsheet data found.

## 2019-11-26 NOTE — Plan of Care (Signed)

## 2019-11-26 NOTE — Progress Notes (Signed)
Inpatient Diabetes Program Recommendations  AACE/ADA: New Consensus Statement on Inpatient Glycemic Control (2015)  Target Ranges:  Prepandial:   less than 140 mg/dL      Peak postprandial:   less than 180 mg/dL (1-2 hours)      Critically ill patients:  140 - 180 mg/dL   Lab Results  Component Value Date   GLUCAP 212 (H) 11/26/2019   HGBA1C 10.7 (H) 11/25/2019    Review of Glycemic Control  Diabetes history: New-diagnosis DM2 Outpatient Diabetes medications: None Current orders for Inpatient glycemic control: IV insulin per [employer] for DKA  HgbA1C - 10.7% 212-234 mg/dL this am   IV at 8 units/H AG - 17, CO2 - 21. Not quite ready for transition.  Inpatient Diabetes Program Recommendations:     When criteria met to transition to SQ insulin, give Lantus 1-2H prior to discontinuation of drip. Lantus 40 units Q24H Novolog 0-20 units Q4H x 12H, then tidwc and hs Novolog 8 units tidwc for meal coverage insulin  Needs daily titration as pt is insulin naive.  Will speak with pt by phone regarding his new diagnosis and HgbA1C. RN to teach insulin pen administration prior to discharge.  Thank you. Lorenda Peck, RD, LDN, CDE Inpatient Diabetes Coordinator [phone]

## 2019-11-27 LAB — GLUCOSE, CAPILLARY
Glucose-Capillary: 231 mg/dL — ABNORMAL HIGH (ref 70–99)
Glucose-Capillary: 240 mg/dL — ABNORMAL HIGH (ref 70–99)
Glucose-Capillary: 256 mg/dL — ABNORMAL HIGH (ref 70–99)
Glucose-Capillary: 284 mg/dL — ABNORMAL HIGH (ref 70–99)
Glucose-Capillary: 286 mg/dL — ABNORMAL HIGH (ref 70–99)
Glucose-Capillary: 296 mg/dL — ABNORMAL HIGH (ref 70–99)
Glucose-Capillary: 318 mg/dL — ABNORMAL HIGH (ref 70–99)

## 2019-11-27 LAB — COMPREHENSIVE METABOLIC PANEL
ALT: 26 U/L (ref 0–44)
AST: 19 U/L (ref 15–41)
Albumin: 3.5 g/dL (ref 3.5–5.0)
Alkaline Phosphatase: 74 U/L (ref 38–126)
Anion gap: 17 — ABNORMAL HIGH (ref 5–15)
BUN: 11 mg/dL (ref 6–20)
CO2: 22 mmol/L (ref 22–32)
Calcium: 8.6 mg/dL — ABNORMAL LOW (ref 8.9–10.3)
Chloride: 100 mmol/L (ref 98–111)
Creatinine, Ser: 0.79 mg/dL (ref 0.61–1.24)
GFR calc Af Amer: >60 mL/min (ref >60)
GFR calc non Af Amer: >60 mL/min (ref >60)
Glucose, Bld: 273 mg/dL — ABNORMAL HIGH (ref 70–99)
Potassium: 2.9 mmol/L — ABNORMAL LOW (ref 3.5–5.1)
Sodium: 139 mmol/L (ref 135–145)
Total Bilirubin: 1.3 mg/dL — ABNORMAL HIGH (ref 0.3–1.2)
Total Protein: 7 g/dL (ref 6.5–8.1)

## 2019-11-27 LAB — C-REACTIVE PROTEIN: CRP: 3.2 mg/dL — ABNORMAL HIGH (ref <1.0)

## 2019-11-27 LAB — CBC WITH DIFFERENTIAL/PLATELET
Abs Immature Granulocytes: 0.01 10*3/uL (ref 0.00–0.07)
Basophils Absolute: 0 10*3/uL (ref 0.0–0.1)
Basophils Relative: 0 %
Eosinophils Absolute: 0 10*3/uL (ref 0.0–0.5)
Eosinophils Relative: 0 %
HCT: 41.2 % (ref 39.0–52.0)
Hemoglobin: 13.9 g/dL (ref 13.0–17.0)
Immature Granulocytes: 0 %
Lymphocytes Relative: 44 %
Lymphs Abs: 1.8 10*3/uL (ref 0.7–4.0)
MCH: 29.6 pg (ref 26.0–34.0)
MCHC: 33.7 g/dL (ref 30.0–36.0)
MCV: 87.7 fL (ref 80.0–100.0)
Monocytes Absolute: 0.4 10*3/uL (ref 0.1–1.0)
Monocytes Relative: 9 %
Neutro Abs: 1.9 10*3/uL (ref 1.7–7.7)
Neutrophils Relative %: 47 %
Platelets: 174 10*3/uL (ref 150–400)
RBC: 4.7 MIL/uL (ref 4.22–5.81)
RDW: 13.9 % (ref 11.5–15.5)
WBC: 4.1 10*3/uL (ref 4.0–10.5)
nRBC: 0 % (ref 0.0–0.2)

## 2019-11-27 LAB — PHOSPHORUS: Phosphorus: 2.8 mg/dL (ref 2.5–4.6)

## 2019-11-27 LAB — MAGNESIUM: Magnesium: 2.3 mg/dL (ref 1.7–2.4)

## 2019-11-27 LAB — D-DIMER, QUANTITATIVE: D-Dimer, Quant: 0.54 ug/mL-FEU — ABNORMAL HIGH (ref 0.00–0.50)

## 2019-11-27 MED ORDER — POTASSIUM CHLORIDE CRYS ER 20 MEQ PO TBCR
60.0000 meq | EXTENDED_RELEASE_TABLET | Freq: Once | ORAL | Status: AC
  Administered 2019-11-27: 60 meq via ORAL
  Filled 2019-11-27: qty 3

## 2019-11-27 MED ORDER — INSULIN DETEMIR 100 UNIT/ML ~~LOC~~ SOLN
24.0000 [IU] | Freq: Two times a day (BID) | SUBCUTANEOUS | Status: DC
  Administered 2019-11-27 – 2019-11-28 (×2): 24 [IU] via SUBCUTANEOUS
  Filled 2019-11-27 (×3): qty 0.24

## 2019-11-27 MED ORDER — POTASSIUM CHLORIDE CRYS ER 20 MEQ PO TBCR: 40.0000 meq | EXTENDED_RELEASE_TABLET | Freq: Once | ORAL | Status: DC

## 2019-11-27 MED ORDER — POTASSIUM CHLORIDE CRYS ER 20 MEQ PO TBCR
40.0000 meq | EXTENDED_RELEASE_TABLET | Freq: Once | ORAL | Status: AC
  Administered 2019-11-27: 40 meq via ORAL
  Filled 2019-11-27: qty 2

## 2019-11-27 NOTE — Progress Notes (Signed)
Inpatient Diabetes Program Recommendations  AACE/ADA: New Consensus Statement on Inpatient Glycemic Control (2015)  Target Ranges:  Prepandial:   less than 140 mg/dL      Peak postprandial:   less than 180 mg/dL (1-2 hours)      Critically ill patients:  140 - 180 mg/dL   Lab Results  Component Value Date   GLUCAP 318 (H) 11/27/2019   HGBA1C 10.7 (H) 11/25/2019    Review of Glycemic Control  Diabetes history: Newly-diagnosed DM2 Outpatient Diabetes medications: None Current orders for Inpatient glycemic control: Levemir 24 units bid, Novolog 0-15 units Q4H + 10 units tidwc  HgbA1C - 10.7% CBGs 256, 286, 318 mg/dL  Inpatient Diabetes Program Recommendations:     Agree with orders. Continue to titrate both basal and bolus insulin.  Ordered Living Well with Diabetes book and attempted to speak with pt about his new diagnosis. Weight-loss and exercise is crucial in controlling blood sugars. OP Diabetes Education consult for new-onset DM.  Pt instructed to use insulin pen per staff RN. Please allow patient to be actively engaged with diabetes management by allowing patient to check own glucose and self-administer insulin injections. Diabetes Coordinator will follow up with patient and reinforce diabetes education.  Will need to f/u with PCP for diabetes management within a week or two of discharge.  Prescription for blood glucose meter (#[id number]) Lantus Solostar pen [phone]) Novolog Flexpen [phone]) Pen needles [phone])  Continue to follow.  Thank you. Ailene Ards, RD, LDN, CDE Inpatient Diabetes Coordinator [phone]

## 2019-11-27 NOTE — Progress Notes (Signed)
PROGRESS NOTE    Jacob Esparza  HGD:[id number] DOB: 2000-12-30 DOA: 11/24/2019 PCP: System, Provider Not In    Brief Narrative:  19 year old male with no significant medical history, history of morbid obesity, mother reported prediabetes but levels unknown presenting to the emergency department with abdominal pain, nausea and vomiting for the last 2 days.  Not able to tolerate any oral intake.  Also coughing and having shortness of breath.  Denies fever or chills.  Unvaccinated against COVID-19.  In the emergency room, afebrile.  Tachycardic.  Blood pressure stable.  On room air.  Hemoconcentrated.  Bicarb less than 7, BUN 27, creatinine 1.8 glucose 697 and anion gap unmeasurable.  COVID-19 positive.  Lipase 52.  Chest x-ray with patchy bilateral airspace opacities.  Started on insulin drip and admitted.   Assessment & Plan:   Principal Problem:   DKA (diabetic ketoacidoses) (HCC) Active Problems:   Diabetes (HCC)   Pneumonia due to COVID-19 virus   Nausea and vomiting   Polycythemia  DKA, new onset diabetes Patient hospitalized for further management of DKA.  He now has newly diagnosed diabetes.  HbA1c 10.7.  He was transitioned from IV insulin infusion to subcutaneous insulin.   CBGs are still elevated.  Continue with adjustment to insulin regimen.  Diabetes education.  He will need to be taught how to self administer insulin.    Hypokalemia/hypophosphatemia/hypomagnesemia Magnesium and phosphorus levels are normal.  Potassium noted to be low again today.  Will be repleted.    Acute kidney injury Secondary to DKA.  Improved with IV hydration.  Monitor urine output.    COVID-19 pneumonia From a respiratory standpoint patient seems to be stable.  His CRP is noted to be 3.2 today.  He does not have any significant respiratory manifestations of the disease.  He is saturating normal on room air.  He was started on remdesivir.  Today will be day 4.  Not given any steroids due to DKA.   Okay to continue to hold off.    Morbid obesity Estimated body mass index is 48.1 kg/m as calculated from the following:   Height as of this encounter: 5\' 6"  (1.676 m).   Weight as of this encounter: 135.2 kg.    DVT prophylaxis: Lovenox subcu Code Status: Full code Family Communication: Discussed with the patient.  Will update mother later today. Disposition Plan: Anticipate discharge tomorrow after his last dose of Remdesivir.  Mobilize.  Ambulate.  Status is: Inpatient  Remains inpatient appropriate because:IV treatments appropriate due to intensity of illness or inability to take PO and Inpatient level of care appropriate due to severity of illness   Dispo:  Patient From: Home  Planned Disposition: Home  Expected discharge date: 11/28/19  Medically stable for discharge: No      Consultants:   None  Procedures:   None  Antimicrobials:  Antibiotics Given (last 72 hours)    Date/Time Action Medication Dose Rate   11/25/19 0134 New Bag/Given   remdesivir 200 mg in sodium chloride 0.9% 250 mL IVPB 200 mg 580 mL/hr   11/26/19 1043 New Bag/Given   remdesivir 100 mg in sodium chloride 0.9 % 100 mL IVPB 100 mg 200 mL/hr   11/27/19 0948 New Bag/Given   remdesivir 100 mg in sodium chloride 0.9 % 100 mL IVPB 100 mg 200 mL/hr         Subjective: Patient states that he is feeling better.  Denies any nausea vomiting or diarrhea.  No shortness of breath  or cough.  No abdominal pain.  Objective: Vitals:   11/27/19 0021 11/27/19 0054 11/27/19 0400 11/27/19 0800  BP:  133/81 (!) 105/57   Pulse:  (!) 103 (!) 105 (!) 105  Resp:  15 19 16   Temp: 98.3 F (36.8 C)  98.5 F (36.9 C) 98.1 F (36.7 C)  TempSrc: Oral  Oral Oral  SpO2:  100% 98% 98%  Weight:      Height:        Intake/Output Summary (Last 24 hours) at 11/27/2019 1125 Last data filed at 11/27/2019 0400 Gross per 24 hour  Intake 487.97 ml  Output 1130 ml  Net -642.03 ml   Filed Weights   11/25/19  0022  Weight: 135.2 kg    Examination:  General appearance: Awake alert.  In no distress Resp: Normal effort at rest.  Few crackles at the bases bilaterally.  No wheezing or rhonchi. Cardio: S1-S2 is normal regular.  No S3-S4.  No rubs murmurs or bruit GI: Abdomen is soft.  Nontender nondistended.  Bowel sounds are present normal.  No masses organomegaly Extremities: No edema.  Full range of motion of lower extremities. Neurologic: Alert and oriented x3.  No focal neurological deficits.      Data Reviewed: I have personally reviewed following labs and imaging studies  CBC: Recent Labs  Lab 11/22/19 2205 11/24/19 2109 11/25/19 0450 11/26/19 0251 11/27/19 0826  WBC 4.3 8.4 10.6* 6.9 4.1  NEUTROABS  --   --   --  5.2 1.9  HGB 16.8 18.8* 17.2* 14.1 13.9  HCT 49.7 58.3* 51.6 41.1 41.2  MCV 86.3 91.8 89.0 86.7 87.7  PLT 230 264 228 178 174   Basic Metabolic Panel: Recent Labs  Lab 11/25/19 1630 11/25/19 2042 11/26/19 0251 11/26/19 1006 11/27/19 0826  NA 135 136 139 136 139  K 4.0 3.6 2.9* 3.8 2.9*  CL 104 105 101 102 100  CO2 15* 15* 21* 20* 22  GLUCOSE 263* 247* 243* 245* 273*  BUN 18 16 13 13 11   CREATININE 1.15 0.93 0.96 0.91 0.79  CALCIUM 8.5* 8.7* 8.3* 8.3* 8.6*  MG  --   --  2.1  --  2.3  PHOS  --   --  1.5*  --  2.8   GFR: Estimated Creatinine Clearance: 192.5 mL/min (by C-G formula based on SCr of 0.79 mg/dL). Liver Function Tests: Recent Labs  Lab 11/22/19 2205 11/24/19 2109 11/26/19 0251 11/27/19 0826  AST 33 39 19 19  ALT 48* 45* 25 26  ALKPHOS 122 123 74 74  BILITOT 1.2 1.5* 0.8 1.3*  PROT 9.3* 10.3* 6.9 7.0  ALBUMIN 4.6 4.7 3.5 3.5   Recent Labs  Lab 11/22/19 2205 11/24/19 2109  LIPASE 27 52*   HbA1C: Recent Labs    11/25/19 0039  HGBA1C 10.7*   CBG: Recent Labs  Lab 11/26/19 1539 11/26/19 2052 11/27/19 0019 11/27/19 0417 11/27/19 0905  GLUCAP 320* 243* 296* 240* 256*   Anemia Panel: Recent Labs    11/25/19 0042    FERRITIN 1,155*   Sepsis Labs: Recent Labs  Lab 11/25/19 0039  PROCALCITON 0.77    Recent Results (from the past 240 hour(s))  SARS Coronavirus 2 by RT PCR (hospital order, performed in [facility] hospital lab) Nasopharyngeal Nasopharyngeal Swab     Status: Abnormal   Collection Time: 11/24/19  6:44 PM   Specimen: Nasopharyngeal Swab  Result Value Ref Range Status   SARS Coronavirus 2 POSITIVE (A) NEGATIVE Final  Comment: RESULT CALLED TO, READ BACK BY AND VERIFIED WITH: ILENE HODGES @ 2111 ON 11/24/19 C VARNER (NOTE) SARS-CoV-2 target nucleic acids are DETECTED  SARS-CoV-2 RNA is generally detectable in upper respiratory specimens  during the acute phase of infection.  Positive results are indicative  of the presence of the identified virus, but do not rule out bacterial infection or co-infection with other pathogens not detected by the test.  Clinical correlation with patient history and  other diagnostic information is necessary to determine patient infection status.  The expected result is negative.  Fact Sheet for Patients:   BoilerBrush.com.cy   Fact Sheet for Healthcare Providers:   https://pope.com/    This test is not yet approved or cleared by the [place] FDA and  has been authorized for detection and/or diagnosis of SARS-CoV-2 by FDA under an Emergency Use Authorization (EUA).  This EUA will remain in effect (meaning thi s test can be used) for the duration of  the COVID-19 declaration under Section 564(b)(1) of the Act, 21 U.S.C. section 360-bbb-3(b)(1), unless the authorization is terminated or revoked sooner.  Performed at [facility], [address], [place], [place] [address]   Culture, blood (routine x 2)     Status: None (Preliminary result)   Collection Time: 11/25/19 12:44 AM   Specimen: BLOOD  Result Value Ref Range Status   Specimen Description   Final    BLOOD BLOOD  RIGHT ARM Performed at [facility], [address], [place], [place] [address]    Special Requests   Final    BOTTLES DRAWN AEROBIC AND ANAEROBIC Blood Culture adequate volume Performed at [facility], [address], [place], [place] [address]    Culture   Final    NO GROWTH 2 DAYS Performed at [facility], [address] [place], [place] [address]    Report Status PENDING  Incomplete  MRSA PCR Screening     Status: None   Collection Time: 11/25/19  5:31 PM   Specimen: Nasal Mucosa; Nasopharyngeal  Result Value Ref Range Status   MRSA by PCR NEGATIVE NEGATIVE Final    Comment:        The GeneXpert MRSA Assay (FDA approved for NASAL specimens only), is one component of a comprehensive MRSA colonization surveillance program. It is not intended to diagnose MRSA infection nor to guide or monitor treatment for MRSA infections. Performed at [facility], [address] [place], [place] [address]          Radiology Studies: No results found.      Scheduled Meds: . vitamin C  500 mg Oral Daily  . Chlorhexidine Gluconate Cloth  6 each Topical Daily  . cholecalciferol  1,000 Units Oral Daily  . enoxaparin (LOVENOX) injection  70 mg Subcutaneous QHS  . insulin aspart  0-15 Units Subcutaneous Q4H  . insulin aspart  10 Units Subcutaneous TID WC  . insulin detemir  20 Units Subcutaneous BID  . potassium chloride  40 mEq Oral BID  . zinc sulfate  220 mg Oral Daily   Continuous Infusions: . sodium chloride Stopped (11/25/19 2040)  . insulin Stopped (11/26/19 1239)  . remdesivir 100 mg in NS 100 mL Stopped (11/27/19 1040)     LOS: 3 days      Osvaldo Shipper, MD [facility]

## 2019-11-28 DIAGNOSIS — R112 Nausea with vomiting, unspecified: Secondary | ICD-10-CM

## 2019-11-28 LAB — COMPREHENSIVE METABOLIC PANEL
ALT: 26 U/L (ref 0–44)
AST: 21 U/L (ref 15–41)
Albumin: 3.7 g/dL (ref 3.5–5.0)
Alkaline Phosphatase: 77 U/L (ref 38–126)
Anion gap: 17 — ABNORMAL HIGH (ref 5–15)
BUN: 12 mg/dL (ref 6–20)
CO2: 23 mmol/L (ref 22–32)
Calcium: 9.3 mg/dL (ref 8.9–10.3)
Chloride: 101 mmol/L (ref 98–111)
Creatinine, Ser: 0.75 mg/dL (ref 0.61–1.24)
GFR calc Af Amer: >60 mL/min (ref >60)
GFR calc non Af Amer: >60 mL/min (ref >60)
Glucose, Bld: 227 mg/dL — ABNORMAL HIGH (ref 70–99)
Potassium: 3.7 mmol/L (ref 3.5–5.1)
Sodium: 141 mmol/L (ref 135–145)
Total Bilirubin: 1.3 mg/dL — ABNORMAL HIGH (ref 0.3–1.2)
Total Protein: 7.3 g/dL (ref 6.5–8.1)

## 2019-11-28 LAB — GLUCOSE, CAPILLARY
Glucose-Capillary: 207 mg/dL — ABNORMAL HIGH (ref 70–99)
Glucose-Capillary: 193 mg/dL — ABNORMAL HIGH (ref 70–99)
Glucose-Capillary: 303 mg/dL — ABNORMAL HIGH (ref 70–99)
Glucose-Capillary: 308 mg/dL — ABNORMAL HIGH (ref 70–99)

## 2019-11-28 LAB — CBC WITH DIFFERENTIAL/PLATELET
Abs Immature Granulocytes: 0.01 10*3/uL (ref 0.00–0.07)
Basophils Absolute: 0 10*3/uL (ref 0.0–0.1)
Basophils Relative: 0 %
Eosinophils Absolute: 0 10*3/uL (ref 0.0–0.5)
Eosinophils Relative: 0 %
HCT: 41.7 % (ref 39.0–52.0)
Hemoglobin: 14.1 g/dL (ref 13.0–17.0)
Immature Granulocytes: 0 %
Lymphocytes Relative: 60 %
Lymphs Abs: 1.8 10*3/uL (ref 0.7–4.0)
MCH: 29.4 pg (ref 26.0–34.0)
MCHC: 33.8 g/dL (ref 30.0–36.0)
MCV: 86.9 fL (ref 80.0–100.0)
Monocytes Absolute: 0.3 10*3/uL (ref 0.1–1.0)
Monocytes Relative: 11 %
Neutro Abs: 0.9 10*3/uL — ABNORMAL LOW (ref 1.7–7.7)
Neutrophils Relative %: 29 %
Platelets: 185 10*3/uL (ref 150–400)
RBC: 4.8 MIL/uL (ref 4.22–5.81)
RDW: 13.7 % (ref 11.5–15.5)
WBC: 3.1 10*3/uL — ABNORMAL LOW (ref 4.0–10.5)
nRBC: 0 % (ref 0.0–0.2)

## 2019-11-28 LAB — C-REACTIVE PROTEIN: CRP: 2.2 mg/dL — ABNORMAL HIGH (ref <1.0)

## 2019-11-28 LAB — D-DIMER, QUANTITATIVE: D-Dimer, Quant: 0.56 ug/mL-FEU — ABNORMAL HIGH (ref 0.00–0.50)

## 2019-11-28 MED ORDER — NOVOLOG FLEXPEN 100 UNIT/ML ~~LOC~~ SOPN: 10.0000 [IU] | PEN_INJECTOR | Freq: Three times a day (TID) | SUBCUTANEOUS | 11 refills | Status: AC

## 2019-11-28 MED ORDER — BLOOD GLUCOSE METER KIT: PACK | 0 refills | Status: AC

## 2019-11-28 MED ORDER — LANTUS SOLOSTAR 100 UNIT/ML ~~LOC~~ SOPN: 24.0000 [IU] | PEN_INJECTOR | Freq: Two times a day (BID) | SUBCUTANEOUS | 11 refills | Status: DC

## 2019-11-28 MED ORDER — INSULIN SYRINGES (DISPOSABLE) U-100 0.3 ML MISC: 3 refills | Status: DC

## 2019-11-28 MED ORDER — ALBUTEROL SULFATE HFA 108 (90 BASE) MCG/ACT IN AERS: 2.0000 | INHALATION_SPRAY | Freq: Four times a day (QID) | RESPIRATORY_TRACT | 0 refills | Status: DC | PRN

## 2019-11-28 NOTE — Discharge Instructions (Addendum)
Stay in isolation until 9/28.  Be sure to wear a mask if you come into contact with anyone.  Ok to get the vaccine after Oct 15th.   Diabetic Ketoacidosis Diabetic ketoacidosis is a serious complication of diabetes. This condition develops when there is not enough insulin in the body. Insulin is an hormone that regulates blood sugar levels in the body. Normally, insulin allows glucose to enter the cells in the body. The cells break down glucose for energy. Without enough insulin, the body cannot break down glucose, so it breaks down fats instead. This leads to high blood glucose levels in the body and the production of acids that are called ketones. Ketones are poisonous at high levels. If diabetic ketoacidosis is not treated, it can cause severe dehydration and can lead to a coma or death. What are the causes? This condition develops when a lack of insulin causes the body to break down fats instead of glucose. This may be triggered by:  Stress on the body. This stress is brought on by an illness.  Infection.  Medicines that raise blood glucose levels.  Not taking diabetes medicine.  New onset of type 1 diabetes mellitus. What are the signs or symptoms? Symptoms of this condition include:  Fatigue.  Weight loss.  Excessive thirst.  Light-headedness.  Fruity or sweet-smelling breath.  Excessive urination.  Vision changes.  Confusion or irritability.  Nausea.  Vomiting.  Rapid breathing.  Abdominal pain.  Feeling flushed. How is this diagnosed? This condition is diagnosed based on your medical history, a physical exam, and blood tests. You may also have a urine test to check for ketones. How is this treated? This condition may be treated with:  Fluid replacement. This may be done to correct dehydration.  Insulin injections. These may be given through the skin or through an IV tube.  Electrolyte replacement. Electrolytes are minerals in your blood.  Electrolytes such as potassium and sodium may be given in pill form or through an IV tube.  Antibiotic medicines. These may be prescribed if your condition was caused by an infection. Diabetic ketoacidosis is a serious medical condition. You may need emergency treatment in the hospital to monitor your condition. Follow these instructions at home: Eating and drinking  Drink enough fluids to keep your urine clear or pale yellow.  If you are not able to eat, drink clear fluids in small amounts as you are able. Clear fluids include water, ice chips, fruit juice with water added (diluted), and low-calorie sports drinks. You may also have sugar-free jello or popsicles.  If you are able to eat, follow your usual diet and drink sugar-free liquids, such as water. Medicines  Take over-the-counter and prescription medicines only as told by your health care provider.  Continue to take insulin and other diabetes medicines as told by your health care provider.  If you were prescribed an antibiotic, take it as told by your health care provider. Do not stop taking the antibiotic even if you start to feel better. General instructions   Check your urine for ketones when you are ill and as told by your health care provider. ? If your blood glucose is 240 mg/dL (02.6 mmol/L) or higher, check your urine ketones every 4-6 hours.  Check your blood glucose every day, as often as told by your health care provider. ? If your blood glucose is high, drink plenty of fluids. This helps to flush out ketones. ? If your blood glucose is above your  target for 2 tests in a row, contact your health care provider.  Carry a medical alert card or wear medical alert jewelry that says that you have diabetes.  Rest and exercise only as told by your health care provider. Do not exercise when your blood glucose is high and you have ketones in your urine.  If you get sick, call your health care provider and begin treatment  quickly. Your body often needs extra insulin to fight an illness. Check your blood glucose every 4-6 hours when you are sick.  Keep all follow-up visits as told by your health care provider. This is important. Contact a health care provider if:  Your blood glucose level is higher than 240 mg/dL (37.1 mmol/L) for 2 days in a row.  You have moderate or large ketones in your urine.  You have a fever.  You cannot eat or drink without vomiting.  You have been vomiting for more than 2 hours.  You continue to have symptoms of diabetic ketoacidosis.  You develop new symptoms. Get help right away if:  Your blood glucose monitor reads "high" even when you are taking insulin.  You faint.  You have chest pain.  You have trouble breathing.  You have sudden trouble speaking or swallowing.  You have vomiting or diarrhea that gets worse after 3 hours.  You are unable to stay awake.  You have trouble thinking.  You are severely dehydrated. Symptoms of severe dehydration include: ? Extreme thirst. ? Dry mouth. ? Rapid breathing. These symptoms may represent a serious problem that is an emergency. Do not wait to see if the symptoms will go away. Get medical help right away. Call your local emergency services (911 in the [place] Do not drive yourself to the hospital. Summary  Diabetic ketoacidosis is a serious complication of diabetes. This condition develops when there is not enough insulin in the body.  This condition is diagnosed based on your medical history, a physical exam, and blood tests. You may also have a urine test to check for ketones.  Diabetic ketoacidosis is a serious medical condition. You may need emergency treatment in the hospital to monitor your condition.  Contact your health care provider if your blood glucose is higher than 240 mg/dl for 2 days in a row or if you have moderate or large ketones in your urine. This information is not intended to replace advice given  to you by your health care provider. Make sure you discuss any questions you have with your health care provider. Document Revised: 04/20/2016 Document Reviewed: 04/09/2016 Elsevier Patient Education  2020 Elsevier Inc.   Preventing Diabetic Ketoacidosis Diabetic ketoacidosis (DKA) is a life-threatening complication of diabetes (diabetes mellitus). It develops when there is not enough of a hormone called insulin in the body. If the body does not have enough insulin, it cannot divide (break down) sugar (glucose) into usable cells, so it breaks down fats instead. This leads to the production of acids (ketones), which can cause the blood to have too much acid in it (acidosis). DKA is a medical emergency that must be treated at the hospital. You may be more likely to develop DKA if you have type 1 diabetes and you take insulin. You can prevent DKA by working closely with your health care provider to manage your diabetes. What nutrition changes can be made?   Follow your meal plan, as directed by your health care provider or diet and nutrition specialist (dietitian).  Eat healthy meals at  about the same time every day. Have healthy snacks between meals.  Avoid not eating for long periods of time. Do not skip meals, especially if you are ill.  Avoid regularly eating foods that contain a lot of sugar. Also avoid drinking alcohol. Sugary food and alcohol increase your risk of high blood glucose (hyperglycemia), which increases your risk for DKA.  Drink enough fluid to keep your urine pale yellow. Dehydration increases your risk for DKA. What actions can I take to lower my risk? To lower your risk for diabetic ketoacidosis, manage your diabetes as directed by your health care provider:  Take insulin and other diabetes medicines as directed.  Check your blood glucose every day, as often as directed.  Follow your sick day plan whenever you cannot eat or drink as usual. Make this plan in advance with  your health care provider.  Check your urine for ketones as often as directed. ? During times when you are sick, check your ketones every 4-6 hours. ? If you develop symptoms of DKA, check your ketones right away.  If you have ketones in your urine: ? Contact your health care provider right away. ? Do not exercise.  Know the symptoms of DKA so that you can get treatment as soon as possible.  Make sure that people at work, home, and school know how to check your blood glucose, in case you are not able to do it yourself.  Carry a medical alert card or wear medical alert jewelry that says that you have diabetes. Why are these changes important? DKA is a warning sign that your diabetes is not being well-controlled. You may need to work with your health care provider to adjust your diabetes management plan. DKA can lead to a serious medical emergency that can be life-threatening. Where to find support For more support with preventing DKA:  Talk with your health care provider.  Consider joining a support group. The American Diabetes Association has an online support community at: community.diabetes.org/home Where to find more information Learn more about preventing DKA from:  American Diabetes Association: www.diabetes.org  American Heart Association: www.heart.org Contact a health care provider if: You develop symptoms of DKA, such as:  Fatigue.  Weight loss.  Excessive thirst.  Light-headedness.  Fruity or sweet-smelling breath.  Excessive urination.  Vision changes.  Confusion or irritability.  Nausea.  Vomiting.  Rapid breathing.  Pain in the abdomen.  Feeling warm in your face (flushed). This may or may not include a reddish color coming to your face. If you develop any of these symptoms, do not wait to see if the symptoms will go away. Get medical help right away. Call your local emergency services (911 in the [place] Do not drive yourself to the  hospital. Summary  DKA may be a warning sign that your diabetes is not being well-controlled. You may need to work with your health care provider to adjust your diabetes management plan.  Preventing high blood glucose and dehydration helps prevent DKA.  Check your urine for ketones as often as directed. You may need to check more often when your blood glucose level is high and when you are ill.  DKA is a medical emergency. Make sure you know the symptoms so that you can recognize and get treatment right away. This information is not intended to replace advice given to you by your health care provider. Make sure you discuss any questions you have with your health care provider.

## 2019-11-28 NOTE — Discharge Summary (Signed)
Discharge Summary  Jacob Esparza TZG:[id number] DOB: 2000-07-22  PCP: System, Provider Not In  Admit date: 11/24/2019 Discharge date: 11/28/2019  Time spent: 25 minutes  Recommendations for Outpatient Follow-up:  1. Patient states that he sees a PCP at [facility].  Have advised him to follow-up with them. 2. Prescriptions given for glucometer, Lantus, NovoLog and associated needles  Discharge Diagnoses:  Active Hospital Problems   Diagnosis Date Noted  . DKA (diabetic ketoacidoses) ([place]) 11/24/2019  . Diabetes ([place]) 11/25/2019  . Pneumonia due to COVID-19 virus 11/25/2019  . Nausea and vomiting 11/25/2019  . Polycythemia 11/25/2019    Resolved Hospital Problems  No resolved problems to display.    Discharge Condition: Improved, being discharged home  Diet recommendation: Carb modified, low-sodium  Vitals:   11/28/19 0850 11/28/19 1245  BP:    Pulse:    Resp:    Temp: 98.1 F (36.7 C) 98.4 F (36.9 C)  SpO2:      History of present illness:  19 year old male with past medical history of morbid obesity and reportedly prediabetic presented to the emergency room on 9/7 with several days of abdominal pain nausea and vomiting as well as coughing and shortness of breath.  Previously had not been vaccinated for COVID-19.  In the emergency room, noted to have a blood sugar of 697 and on measurably high anion gap.  COVID-19 positive.  Chest x-ray with bilateral patchy infiltrates.  No evidence of hypoxia.  Patient was admitted to the hospitalist service and placed in the ICU for DKA and Covid.  Hospital Course:  Principal Problem:   DKA (diabetic ketoacidoses) ([place]) with underlying type 2 diabetes mellitus, new onset: Patient treated with aggressive IV fluids and insulin drip.  Able to be weaned off of insulin drip by 9/9.  Change over to subcu Lantus and sliding scale.  Tolerated this well and by day of discharge, will be discharged on Lantus 24 units twice a day plus NovoLog 10 units  3 times a day with meals.  Has been given prescriptions and referrals for outpatient diabetes education and counseling. Active Problems:      Pneumonia due to COVID-19 virus: Overall stable, no evidence of hypoxia during his hospitalization so he did not need steroids.  Started on Remdisivir on admission.  By 9/11, completed 5 days and did well.  CRP on day of discharge down to 2.2.  Patient advised to stay in isolation until 9/28.  He is able to take the vaccine after 10/17.    Nausea and vomiting: Secondary DKA.    Polycythemia Morbid obesity: Meets criteria BMI greater than 40  Procedures:  None  Consultations:  None  Discharge Exam: BP 134/68 (BP Location: Left Arm)   Pulse (!) 109   Temp 98.4 F (36.9 C) (Oral)   Resp 20   Ht $R'5\' 6"'Eb$  (1.676 m)   Wt 135.2 kg   SpO2 99%   BMI 48.10 kg/m   General: Alert and oriented x3, no acute distress Cardiovascular: Regular rate and rhythm, borderline tachycardia Respiratory: Clear to auscultation bilaterally  Discharge Instructions You were cared for by a hospitalist during your hospital stay. If you have any questions about your discharge medications or the care you received while you were in the hospital after you are discharged, you can call the unit and asked to speak with the hospitalist on call if the hospitalist that took care of you is not available. Once you are discharged, your primary care physician will handle any  further medical issues. Please note that NO REFILLS for any discharge medications will be authorized once you are discharged, as it is imperative that you return to your primary care physician (or establish a relationship with a primary care physician if you do not have one) for your aftercare needs so that they can reassess your need for medications and monitor your lab values.  Discharge Instructions    Ambulatory referral to Nutrition and Diabetic Education   Complete by: As directed    Diet - low sodium heart  healthy   Complete by: As directed    Increase activity slowly   Complete by: As directed      Allergies as of 11/28/2019   No Known Allergies     Medication List    TAKE these medications   albuterol 108 (90 Base) MCG/ACT inhaler Commonly known as: VENTOLIN HFA Inhale 2 puffs into the lungs every 6 (six) hours as needed for wheezing or shortness of breath.   blood glucose meter kit and supplies Dispense based on patient and insurance preference. Use up to four times daily as directed. (FOR ICD-10 E10.9, E11.9).   Insulin Syringes (Disposable) U-100 0.3 ML Misc Use as directed   Lantus SoloStar 100 UNIT/ML Solostar Pen Generic drug: insulin glargine Inject 24 Units into the skin 2 (two) times daily.   NovoLOG FlexPen 100 UNIT/ML FlexPen Generic drug: insulin aspart Inject 10 Units into the skin 3 (three) times daily with meals.      No Known Allergies    The results of significant diagnostics from this hospitalization (including imaging, microbiology, ancillary and laboratory) are listed below for reference.    Significant Diagnostic Studies: DG Chest Portable 1 View  Result Date: 11/24/2019 CLINICAL DATA:  Shortness of breath, nausea, COVID. EXAM: PORTABLE CHEST 1 VIEW COMPARISON:  None. FINDINGS: Low lung volumes. Patchy heterogeneous bilateral airspace opacities in a mid-lower lung zone predominant distribution, right greater than left. Heart is normal in size with normal mediastinal contours. No pleural effusion. No pneumothorax or evidence of pneumomediastinum. No acute osseous abnormalities are seen. IMPRESSION: Patchy bilateral airspace opacities in a mid-lower lung zone predominant distribution, pattern consistent with COVID-19 pneumonia. Electronically Signed   By: Keith Rake M.D.   On: 11/24/2019 23:08    Microbiology: Recent Results (from the past 240 hour(s))  SARS Coronavirus 2 by RT PCR (hospital order, performed in [facility] hospital lab)  Nasopharyngeal Nasopharyngeal Swab     Status: Abnormal   Collection Time: 11/24/19  6:44 PM   Specimen: Nasopharyngeal Swab  Result Value Ref Range Status   SARS Coronavirus 2 POSITIVE (A) NEGATIVE Final    Comment: RESULT CALLED TO, READ BACK BY AND VERIFIED WITH: ILENE HODGES @ 2111 ON 11/24/19 C VARNER (NOTE) SARS-CoV-2 target nucleic acids are DETECTED  SARS-CoV-2 RNA is generally detectable in upper respiratory specimens  during the acute phase of infection.  Positive results are indicative  of the presence of the identified virus, but do not rule out bacterial infection or co-infection with other pathogens not detected by the test.  Clinical correlation with patient history and  other diagnostic information is necessary to determine patient infection status.  The expected result is negative.  Fact Sheet for Patients:   StrictlyIdeas.no   Fact Sheet for Healthcare Providers:   BankingDealers.co.za    This test is not yet approved or cleared by the [place] FDA and  has been authorized for detection and/or diagnosis of SARS-CoV-2 by FDA under an  Emergency Use Authorization (EUA).  This EUA will remain in effect (meaning thi s test can be used) for the duration of  the COVID-19 declaration under Section 564(b)(1) of the Act, 21 U.S.C. section 360-bbb-3(b)(1), unless the authorization is terminated or revoked sooner.  Performed at [facility], [place] [address], [place], [place] [address]   Culture, blood (routine x 2)     Status: None (Preliminary result)   Collection Time: 11/25/19 12:44 AM   Specimen: BLOOD  Result Value Ref Range Status   Specimen Description   Final    BLOOD BLOOD RIGHT ARM Performed at [place] [address], [place], [place] [address]    Special Requests   Final    BOTTLES DRAWN AEROBIC AND ANAEROBIC Blood Culture adequate volume Performed at [place] [address], [place], [place] [address]    Culture   Final    NO GROWTH 3 DAYS Performed at [facility], [place] [address] [place], [place] [address]    Report Status PENDING  Incomplete  MRSA PCR Screening     Status: None   Collection Time: 11/25/19  5:31 PM   Specimen: Nasal Mucosa; Nasopharyngeal  Result Value Ref Range Status   MRSA by PCR NEGATIVE NEGATIVE Final    Comment:        The GeneXpert MRSA Assay (FDA approved for NASAL specimens only), is one component of a comprehensive MRSA colonization surveillance program. It is not intended to diagnose MRSA infection nor to guide or monitor treatment for MRSA infections. Performed at [facility], [place] [address], [place], [place] [address]      Labs: Basic Metabolic Panel: Recent Labs  Lab 11/25/19 2042 11/26/19 0251 11/26/19 1006 11/27/19 0826 11/28/19 0311  NA 136 139 136 139 141  K 3.6 2.9* 3.8 2.9* 3.7  CL 105 101 102 100 101  CO2 15* 21* 20* 22 23  GLUCOSE 247* 243* 245* 273* 227*  BUN $Re'16 13 13 11 12  'nPe$ CREATININE 0.93 0.96 0.91 0.79 0.75  CALCIUM 8.7* 8.3* 8.3* 8.6* 9.3  MG  --  2.1  --  2.3  --   PHOS  --  1.5*  --  2.8  --    Liver Function Tests: Recent Labs  Lab 11/22/19 2205 11/24/19 2109 11/26/19 0251 11/27/19 0826 11/28/19 0311  AST 33 39 $Rem'19 19 21  'LTnD$ ALT 48* 45* $Remov'25 26 26  'hjwJlR$ ALKPHOS 122 123 74 74 77  BILITOT 1.2 1.5* 0.8 1.3* 1.3*  PROT 9.3* 10.3* 6.9 7.0 7.3  ALBUMIN 4.6 4.7 3.5 3.5 3.7   Recent Labs  Lab 11/22/19 2205 11/24/19 2109  LIPASE 27 52*   No results for input(s): AMMONIA in the last 168 hours. CBC: Recent Labs  Lab 11/24/19 2109 11/25/19 0450 11/26/19 0251 11/27/19 0826 11/28/19 0311  WBC 8.4 10.6* 6.9 4.1 3.1*  NEUTROABS  --   --  5.2 1.9 0.9*  HGB 18.8* 17.2* 14.1 13.9 14.1  HCT 58.3* 51.6 41.1 41.2 41.7  MCV 91.8 89.0 86.7 87.7 86.9  PLT 264 228 178 174 185   Cardiac Enzymes: No results for input(s): CKTOTAL,  CKMB, CKMBINDEX, TROPONINI in the last 168 hours. BNP: BNP (last 3 results) No results for input(s): BNP in the last 8760 hours.  ProBNP (last 3 results) No results for input(s): PROBNP in the last 8760 hours.  CBG: Recent Labs  Lab 11/27/19 1948 11/27/19 2319 11/28/19 0415 11/28/19 2458  11/28/19 1236  [place]       Signed:  Annita Brod, MD [facility] 11/28/2019, 3:28 PM

## 2019-12-02 LAB — CULTURE, BLOOD (ROUTINE X 2)
Culture: NO GROWTH
Special Requests: ADEQUATE

## 2019-12-23 ENCOUNTER — Ambulatory Visit: Payer: Medicaid Other | Admitting: Registered"

## 2020-03-07 ENCOUNTER — Ambulatory Visit (HOSPITAL_COMMUNITY)
Admission: EM | Admit: 2020-03-07 | Discharge: 2020-03-07 | Disposition: A | Discharge status: Home / Self Care | Payer: Medicaid Other | Attending: Family Medicine | Admitting: Family Medicine

## 2020-03-07 ENCOUNTER — Other Ambulatory Visit: Payer: Self-pay

## 2020-03-07 ENCOUNTER — Encounter (HOSPITAL_COMMUNITY): Payer: Self-pay | Admitting: Emergency Medicine

## 2020-03-07 DIAGNOSIS — Z760 Encounter for issue of repeat prescription: Secondary | ICD-10-CM

## 2020-03-07 HISTORY — DX: Type 2 diabetes mellitus without complications: E11.9

## 2020-03-07 MED ORDER — INSULIN SYRINGES (DISPOSABLE) U-100 0.3 ML MISC: 3 refills | Status: AC

## 2020-03-07 NOTE — ED Provider Notes (Signed)
[place]    CSN: [id number] Arrival date & time: 03/07/20  4098      History   Chief Complaint Chief Complaint  Patient presents with  . diabetes supplies    HPI Jacob Esparza is a 19 y.o. male.   Patient is a 19 year old male past medical history of diabetes, pneumonia, DKA.  He is presenting today needing refill on his diabetic insulin needles.  Ran out 2 weeks ago.  He has been taking his insulin as prescribed.  Otherwise he is feeling okay.     Past Medical History:  Diagnosis Date  . Diabetes mellitus without complication [facility])     Patient Active Problem List   Diagnosis Date Noted  . Diabetes ([place]) 11/25/2019  . Pneumonia due to COVID-19 virus 11/25/2019  . Nausea and vomiting 11/25/2019  . Polycythemia 11/25/2019  . DKA (diabetic ketoacidoses) 11/24/2019    History reviewed. No pertinent surgical history.     Home Medications    Prior to Admission medications   Medication Sig Start Date End Date Taking? Authorizing Provider  albuterol (VENTOLIN HFA) 108 (90 Base) MCG/ACT inhaler Inhale 2 puffs into the lungs every 6 (six) hours as needed for wheezing or shortness of breath. 11/28/19   Annita Brod, MD  blood glucose meter kit and supplies Dispense based on patient and insurance preference. Use up to four times daily as directed. (FOR ICD-10 E10.9, E11.9). 11/28/19   Annita Brod, MD  insulin aspart (NOVOLOG FLEXPEN) 100 UNIT/ML FlexPen Inject 10 Units into the skin 3 (three) times daily with meals. 11/28/19   Annita Brod, MD  insulin glargine (LANTUS SOLOSTAR) 100 UNIT/ML Solostar Pen Inject 24 Units into the skin 2 (two) times daily. 11/28/19   Annita Brod, MD  Insulin Syringes, Disposable, U-100 0.3 ML MISC Use as directed 03/07/20   Orvan July, NP    Family History Family History  Problem Relation Age of Onset  . Healthy Mother   . Diabetes Father     Social History Social History   Tobacco Use  .  Smoking status: Never Smoker  . Smokeless tobacco: Never Used  Vaping Use  . Vaping Use: Never used  Substance Use Topics  . Alcohol use: Not Currently  . Drug use: Never     Allergies   Patient has no known allergies.   Review of Systems Review of Systems   Physical Exam Triage Vital Signs ED Triage Vitals  Enc Vitals Group     BP 03/07/20 1016 (!) 132/92     Pulse Rate 03/07/20 1016 (!) 104     Resp 03/07/20 1016 18     Temp 03/07/20 1016 98.4 F (36.9 C)     Temp Source 03/07/20 1016 Oral     SpO2 03/07/20 1016 98 %     Weight --      Height --      Head Circumference --      Peak Flow --      Pain Score 03/07/20 1014 0     Pain Loc --      Pain Edu? --      Excl. in [place]? --    No data found.  Updated Vital Signs BP (!) 132/92 (BP Location: Right Arm) Comment (BP Location): large cuff  Pulse (!) 104   Temp 98.4 F (36.9 C) (Oral)   Resp 18   SpO2 98%   Visual Acuity Right Eye Distance:   Left  Eye Distance:   Bilateral Distance:    Right Eye Near:   Left Eye Near:    Bilateral Near:     Physical Exam Vitals and nursing note reviewed.  Constitutional:      Appearance: Normal appearance.  HENT:     Head: Normocephalic and atraumatic.     Nose: Nose normal.  Eyes:     Conjunctiva/sclera: Conjunctivae normal.  Pulmonary:     Effort: Pulmonary effort is normal.  Musculoskeletal:        General: Normal range of motion.     Cervical back: Normal range of motion.  Skin:    General: Skin is warm and dry.  Neurological:     Mental Status: He is alert.  Psychiatric:        Mood and Affect: Mood normal.      UC Treatments / Results  Labs (all labs ordered are listed, but only abnormal results are displayed) Labs Reviewed - No data to display  EKG   Radiology No results found.  Procedures Procedures (including critical care time)  Medications Ordered in UC Medications - No data to display  Initial Impression / Assessment and Plan /  UC Course  I have reviewed the triage vital signs and the nursing notes.  Pertinent labs & imaging results that were available during my care of the patient were reviewed by me and considered in my medical decision making (see chart for details).     Medication refill Refilled the insulin syringes as requested.  Contacts given for primary care for follow up.  No other concerns today.   Final Clinical Impressions(s) / UC Diagnoses   Final diagnoses:  Medication refill     Discharge Instructions     We have refilled your insulin needles.  I have put some contact on your discharge instructions for primary care. Follow up as needed for continued or worsening symptoms     ED Prescriptions    Medication Sig Dispense Auth. Provider   Insulin Syringes, Disposable, U-100 0.3 ML MISC Use as directed 90 each Particia Strahm A, NP     PDMP not reviewed this encounter.   Orvan July, NP 03/07/20 1705

## 2020-03-07 NOTE — Discharge Instructions (Addendum)
We have refilled your insulin needles.  I have put some contact on your discharge instructions for primary care. Follow up as needed for continued or worsening symptoms

## 2020-03-07 NOTE — ED Triage Notes (Signed)
patient [facility], but unable to get a name of a provider that he has seen for diabetes. Ran out of needles 2 weeks ago.  Patient states he still has insulin.

## 2020-05-15 ENCOUNTER — Other Ambulatory Visit: Payer: Self-pay

## 2020-05-15 ENCOUNTER — Ambulatory Visit (HOSPITAL_COMMUNITY)
Admission: EM | Admit: 2020-05-15 | Discharge: 2020-05-15 | Disposition: A | Discharge status: Home / Self Care | Payer: Medicaid Other | Attending: Family Medicine | Admitting: Family Medicine

## 2020-05-15 ENCOUNTER — Encounter (HOSPITAL_COMMUNITY): Payer: Self-pay | Admitting: *Deleted

## 2020-05-15 DIAGNOSIS — L0231 Cutaneous abscess of buttock: Secondary | ICD-10-CM

## 2020-05-15 MED ORDER — SULFAMETHOXAZOLE-TRIMETHOPRIM 800-160 MG PO TABS: 1.0000 | ORAL_TABLET | Freq: Two times a day (BID) | ORAL | 0 refills | Status: AC

## 2020-05-15 NOTE — Discharge Instructions (Signed)
Take warm sitz bath several times daily and complete full course of antibiotics.  Return for further evaluation if worsening over the next week.

## 2020-05-15 NOTE — ED Provider Notes (Signed)
[place]    CSN: [id number] Arrival date & time: 05/15/20  1007      History   Chief Complaint Chief Complaint  Patient presents with  . Abscess    HPI Jacob Esparza is a 20 y.o. male.   Patient here today for evaluation of 2-day history of left superior gluteal pain, swelling that has progressively worsened.  He thinks he has a boil in this area.  He denies fever, chills, sweats, body aches, drainage to the area.  No injury to the area known, and no history of abscesses.  Has not tried anything over-the-counter for symptoms at this time.  Medical history pertinent for insulin-dependent diabetes.     Past Medical History:  Diagnosis Date  . Diabetes mellitus without complication [facility])     Patient Active Problem List   Diagnosis Date Noted  . Diabetes ([place]) 11/25/2019  . Pneumonia due to COVID-19 virus 11/25/2019  . Nausea and vomiting 11/25/2019  . Polycythemia 11/25/2019  . DKA (diabetic ketoacidoses) 11/24/2019    History reviewed. No pertinent surgical history.     Home Medications    Prior to Admission medications   Medication Sig Start Date End Date Taking? Authorizing Provider  albuterol (VENTOLIN HFA) 108 (90 Base) MCG/ACT inhaler Inhale 2 puffs into the lungs every 6 (six) hours as needed for wheezing or shortness of breath. 11/28/19  Yes Annita Brod, MD  blood glucose meter kit and supplies Dispense based on patient and insurance preference. Use up to four times daily as directed. (FOR ICD-10 E10.9, E11.9). 11/28/19  Yes Annita Brod, MD  insulin aspart (NOVOLOG FLEXPEN) 100 UNIT/ML FlexPen Inject 10 Units into the skin 3 (three) times daily with meals. 11/28/19  Yes Annita Brod, MD  insulin glargine (LANTUS SOLOSTAR) 100 UNIT/ML Solostar Pen Inject 24 Units into the skin 2 (two) times daily. 11/28/19  Yes Annita Brod, MD  Insulin Syringes, Disposable, U-100 0.3 ML MISC Use as directed 03/07/20  Yes Bast, Traci A, NP   sulfamethoxazole-trimethoprim (BACTRIM DS) 800-160 MG tablet Take 1 tablet by mouth 2 (two) times daily for 7 days. 05/15/20 05/22/20 Yes Volney American, PA-C    Family History Family History  Problem Relation Age of Onset  . Healthy Mother   . Diabetes Father     Social History Social History   Tobacco Use  . Smoking status: Never Smoker  . Smokeless tobacco: Never Used  Vaping Use  . Vaping Use: Never used  Substance Use Topics  . Alcohol use: Not Currently  . Drug use: Never     Allergies   Patient has no known allergies.   Review of Systems Review of Systems Per HPI  Physical Exam Triage Vital Signs ED Triage Vitals [05/15/20 1033]  Enc Vitals Group     BP 127/86     Pulse Rate (!) 118     Resp 18     Temp 99.3 F (37.4 C)     Temp Source Oral     SpO2 98 %     Weight      Height      Head Circumference      Peak Flow      Pain Score 8     Pain Loc      Pain Edu?      Excl. in [place]?    No data found.  Updated Vital Signs BP 127/86 (BP Location: Right Arm)   Pulse (!) 118  Temp 99.3 F (37.4 C) (Oral)   Resp 18   SpO2 98%   Visual Acuity Right Eye Distance:   Left Eye Distance:   Bilateral Distance:    Right Eye Near:   Left Eye Near:    Bilateral Near:     Physical Exam Vitals and nursing note reviewed. Exam conducted with a chaperone present.  Constitutional:      Appearance: Normal appearance.  HENT:     Head: Atraumatic.  Eyes:     Extraocular Movements: Extraocular movements intact.     Conjunctiva/sclera: Conjunctivae normal.  Cardiovascular:     Rate and Rhythm: Normal rate and regular rhythm.  Pulmonary:     Effort: Pulmonary effort is normal.     Breath sounds: Normal breath sounds.  Musculoskeletal:        General: Normal range of motion.     Cervical back: Normal range of motion and neck supple.  Skin:    General: Skin is warm and dry.     Comments: Area palpable nodule, tender to palpation superior left  gluteal fold.  No erythema or fluctuance in the area.  No active drainage.  Neurological:     General: No focal deficit present.     Mental Status: He is oriented to person, place, and time.  Psychiatric:        Mood and Affect: Mood normal.        Thought Content: Thought content normal.        Judgment: Judgment normal.      UC Treatments / Results  Labs (all labs ordered are listed, but only abnormal results are displayed) Labs Reviewed - No data to display  EKG   Radiology No results found.  Procedures Procedures (including critical care time)  Medications Ordered in UC Medications - No data to display  Initial Impression / Assessment and Plan / UC Course  I have reviewed the triage vital signs and the nursing notes.  Pertinent labs & imaging results that were available during my care of the patient were reviewed by me and considered in my medical decision making (see chart for details).     Probable early abscess formation.  No indication for I&D today as there is no fluctuance, erythema to the area.  Will start course of Bactrim, sitz bath's, ibuprofen as needed for pain and inflammation.  Work note given for today.  Follow-up with primary care next week for recheck.  Return sooner if symptoms worsening in meantime.  Final Clinical Impressions(s) / UC Diagnoses   Final diagnoses:  Left buttock abscess     Discharge Instructions     Take warm sitz bath several times daily and complete full course of antibiotics.  Return for further evaluation if worsening over the next week.    ED Prescriptions    Medication Sig Dispense Auth. Provider   sulfamethoxazole-trimethoprim (BACTRIM DS) 800-160 MG tablet Take 1 tablet by mouth 2 (two) times daily for 7 days. 14 tablet Volney American, [place]     PDMP not reviewed this encounter.   Volney American, [place] 05/15/20 1102

## 2020-05-15 NOTE — ED Triage Notes (Signed)
Pt reports abscess between buttocks for past 3 days.

## 2020-07-11 ENCOUNTER — Ambulatory Visit (HOSPITAL_BASED_OUTPATIENT_CLINIC_OR_DEPARTMENT_OTHER): Payer: Medicaid Other | Admitting: Family Medicine

## 2020-07-18 ENCOUNTER — Other Ambulatory Visit (HOSPITAL_BASED_OUTPATIENT_CLINIC_OR_DEPARTMENT_OTHER)
Admission: RE | Admit: 2020-07-18 | Discharge: 2020-07-18 | Disposition: A | Discharge status: Home / Self Care | Payer: Medicaid Other | Source: Ambulatory Visit | Attending: Family Medicine | Admitting: Family Medicine

## 2020-07-18 ENCOUNTER — Other Ambulatory Visit: Payer: Self-pay

## 2020-07-18 ENCOUNTER — Ambulatory Visit (INDEPENDENT_AMBULATORY_CARE_PROVIDER_SITE_OTHER): Payer: Medicaid Other | Admitting: Family Medicine

## 2020-07-18 ENCOUNTER — Encounter (HOSPITAL_BASED_OUTPATIENT_CLINIC_OR_DEPARTMENT_OTHER): Payer: Self-pay | Admitting: Family Medicine

## 2020-07-18 VITALS — BP 118/90 | HR 97 | Ht 66.0 in | Wt 222.6 lb

## 2020-07-18 DIAGNOSIS — Z7689 Persons encountering health services in other specified circumstances: Secondary | ICD-10-CM | POA: Insufficient documentation

## 2020-07-18 DIAGNOSIS — Z794 Long term (current) use of insulin: Secondary | ICD-10-CM

## 2020-07-18 DIAGNOSIS — E1169 Type 2 diabetes mellitus with other specified complication: Secondary | ICD-10-CM | POA: Insufficient documentation

## 2020-07-18 DIAGNOSIS — E669 Obesity, unspecified: Secondary | ICD-10-CM | POA: Diagnosis present

## 2020-07-18 LAB — COMPREHENSIVE METABOLIC PANEL
ALT: 17 U/L (ref 0–44)
AST: 14 U/L — ABNORMAL LOW (ref 15–41)
Albumin: 4.7 g/dL (ref 3.5–5.0)
Alkaline Phosphatase: 141 U/L — ABNORMAL HIGH (ref 38–126)
Anion gap: 8 (ref 5–15)
BUN: 11 mg/dL (ref 6–20)
CO2: 30 mmol/L (ref 22–32)
Calcium: 10.7 mg/dL — ABNORMAL HIGH (ref 8.9–10.3)
Chloride: 96 mmol/L — ABNORMAL LOW (ref 98–111)
Creatinine, Ser: 0.95 mg/dL (ref 0.61–1.24)
GFR, Estimated: >60 mL/min (ref >60)
Glucose, Bld: 202 mg/dL — ABNORMAL HIGH (ref 70–99)
Potassium: 3.9 mmol/L (ref 3.5–5.1)
Sodium: 134 mmol/L — ABNORMAL LOW (ref 135–145)
Total Bilirubin: 0.7 mg/dL (ref 0.3–1.2)
Total Protein: 7.8 g/dL (ref 6.5–8.1)

## 2020-07-18 LAB — CBC WITH DIFFERENTIAL/PLATELET
Abs Immature Granulocytes: 0 10*3/uL (ref 0.00–0.07)
Basophils Absolute: 0 10*3/uL (ref 0.0–0.1)
Basophils Relative: 1 %
Eosinophils Absolute: 0.1 10*3/uL (ref 0.0–0.5)
Eosinophils Relative: 1 %
HCT: 47.3 % (ref 39.0–52.0)
Hemoglobin: 16.1 g/dL (ref 13.0–17.0)
Immature Granulocytes: 0 %
Lymphocytes Relative: 75 %
Lymphs Abs: 4 10*3/uL (ref 0.7–4.0)
MCH: 29.4 pg (ref 26.0–34.0)
MCHC: 34 g/dL (ref 30.0–36.0)
MCV: 86.3 fL (ref 80.0–100.0)
Monocytes Absolute: 0.4 10*3/uL (ref 0.1–1.0)
Monocytes Relative: 7 %
Neutro Abs: 0.9 10*3/uL — ABNORMAL LOW (ref 1.7–7.7)
Neutrophils Relative %: 16 %
Platelets: 230 10*3/uL (ref 150–400)
RBC: 5.48 MIL/uL (ref 4.22–5.81)
RDW: 12.7 % (ref 11.5–15.5)
Smear Review: NORMAL
WBC: 5.4 10*3/uL (ref 4.0–10.5)
nRBC: 0 % (ref 0.0–0.2)

## 2020-07-18 LAB — LIPID PANEL
Cholesterol: 302 mg/dL — ABNORMAL HIGH (ref 0–200)
HDL: 34 mg/dL — ABNORMAL LOW (ref >40)
LDL Cholesterol: 219 mg/dL — ABNORMAL HIGH (ref 0–99)
Total CHOL/HDL Ratio: 8.9 RATIO
Triglycerides: 246 mg/dL — ABNORMAL HIGH (ref <150)
VLDL: 49 mg/dL — ABNORMAL HIGH (ref 0–40)

## 2020-07-18 LAB — POCT UA - MICROALBUMIN
Creatinine, POC: 100 mg/dL
Microalbumin Ur, POC: 30 mg/L

## 2020-07-18 NOTE — Patient Instructions (Signed)
  Medication Instructions:  Your physician recommends that you continue on your current medications as directed. Please refer to the Current Medication list given to you today. --If you need a refill on any your medications before your next appointment, please call your pharmacy first. If no refills are authorized on file call the office.--  Lab Work: Your physician has recommended that you have lab work today: A1C, Comprehensive Metabolic Panel, CBC, Urine Microalbumin, and Lipid Panel If you have labs (blood work) drawn today and your tests are completely normal, you will receive your results only by: Marland Kitchen MyChart Message (if you have MyChart) OR . A phone call from our staff. Please ensure you check your voicemail in the event that you authorized detailed messages to be left on a delegated number. If you have any lab test that is abnormal or we need to change your treatment, we will call you to review the results.  Referrals/Procedures/Imaging: A referral has been placed for you to Ophthalmology for Diabetic Retinal Exam. Someone from the scheduling department will be in contact with you in regards to coordinating your consultation. If you do not hear from any of the schedulers within 7-10 business days please give our office a call.  A referral has been placed for you to Diabetic Medical Nutrition Therapy. Someone from the scheduling department will be in contact with you in regards to coordinating your consultation. If you do not hear from any of the schedulers within 7-10 business days please give our office a call.    Follow-Up: Your next appointment:   Your physician recommends that you schedule a follow-up appointment in: 2 WEEKS with Dr. de Peru  Thanks for letting us be apart of your health journey!!  Primary Care and Sports Medicine   Dr. de Peru and Shawna Clamp, DNP, AGNP  We recommend signing up for the patient portal called "MyChart".  Sign up information is provided on this  After Visit Summary.  MyChart is used to connect with patients for Virtual Visits (Telemedicine).  Patients are able to view lab/test results, encounter notes, upcoming appointments, etc.  Non-urgent messages can be sent to your provider as well.   To learn more about what you can do with MyChart, please visit --  [email or web address].     Check blood sugars daily and record on the log provided Breakfast - Fasting blood sugar before work Lunch - Blood sugar before you eat at work Futures trader - After work before you go to sleep

## 2020-07-18 NOTE — Progress Notes (Signed)
New Patient Office Visit  Subjective:  Patient ID: Jacob Esparza, male    DOB: 12/11/2000  Age: 20 y.o. MRN: [id number]  CC:  Chief Complaint  Patient presents with  . Establish Care  . Diabetes    Recently diagnosed within the last 6 months and has had no primary management. Is currently on insulin. Patient is requesting a referral for medical nutrition therapy    HPI Jacob Esparza is a 20 year old male presenting to establish in clinic.  Denies any specific concerns today.  Past medical history significant for diabetes.  Diabetes: Diagnosed in September 2021 when patient presented to the emergency room and was found to be in DKA.  At discharge from the hospital, he was managed with basal and prandial insulin with Lantus and NovoLog.  He has continued with insulin as prescribed at discharge with 24 units of Lantus administered in the morning and at night and 10 units of NovoLog administered with meals.  He reports checking his blood sugar about twice daily, does not record readings.  Recalls that his blood sugars are typically within the 200-300 range.  Patient does work night shifts and that is his "fasting" blood sugar tends to be in the evening before going to work.  Thinks that this reading is typically in the high 200s.  Reports some polyuria/polydipsia.  Has not met with a nutritionist or diabetic educator in the past.  Feels that he sleeps okay and denies any significant somnolence.  Past Medical History:  Diagnosis Date  . Diabetes mellitus without complication ([place])     History reviewed. No pertinent surgical history.  Family History  Problem Relation Age of Onset  . Healthy Mother   . Diabetes Father   . Healthy Maternal Grandmother   . Diabetes Maternal Grandfather   . Healthy Paternal Grandmother     Social History   Socioeconomic History  . Marital status: Single    Spouse name: Not on file  . Number of children: Not on file  . Years of education: Not on  file  . Highest education level: Not on file  Occupational History  . Not on file  Tobacco Use  . Smoking status: Never Smoker  . Smokeless tobacco: Never Used  Vaping Use  . Vaping Use: Never used  Substance and Sexual Activity  . Alcohol use: Not Currently  . Drug use: Never  . Sexual activity: Not Currently  Other Topics Concern  . Not on file  Social History Narrative  . Not on file   Social Determinants of Health   Financial Resource Strain: Not on file  Food Insecurity: Not on file  Transportation Needs: Not on file  Physical Activity: Not on file  Stress: Not on file  Social Connections: Not on file  Intimate Partner Violence: Not on file    Objective:   Today's Vitals: BP 118/90   Pulse 97   Ht $R'5\' 6"'fp$  (1.676 m)   Wt 222 lb 9.6 oz (101 kg)   SpO2 97%   BMI 35.93 kg/m   Physical Exam  Pleasant 20 year old male in no acute distress Cardiovascular exam with regular rate and rhythm, no murmurs appreciated Lungs clear to auscultation bilaterally Diabetic foot exam completed today, documented in chart  Assessment & Plan:   Problem List Items Addressed This Visit      Endocrine   Diabetes [facility]) - Primary    Chronic, uncontrolled Originally diagnosed in September 2021 when patient had to the emergency department  in DKA Long discussion today with patient regarding diabetes, appropriate management, prognosis, potential for complications Refer to nutritionist/diabetic educator Discussed appropriate lifestyle modifications Diabetic medications: Continue with Lantus 24 units twice daily and NovoLog 10 units with meals Check blood sugars regularly and record on log provided -check fasting, pre and postprandial in order to make any necessary adjustments on insulin dosage for better control of blood sugars Check A1c, CMP, lipid panel, CBC, MA/CR ratio Refer to ophthalmology for retinopathy screening Recommend pneumococcal vaccination, patient declines at this  time Plan for close follow-up with patient to stress importance of gaining appropriate control of diabetes      Relevant Orders   CBC with Differential/Platelet (Completed)   Comprehensive metabolic panel (Completed)   Hemoglobin A1c   POCT UA - Microalbumin (Completed)   Lipid panel (Completed)   Ambulatory referral to Ophthalmology   Referral to Nutrition and Diabetes Services     Other   Obesity, Class II, BMI 35-39.9    Lifestyle modifications discussed as above As above, refer to nutritionist Encouraged gradual healthy weight loss, gradual increase in level of physical activity      Relevant Orders   CBC with Differential/Platelet (Completed)   Comprehensive metabolic panel (Completed)   Lipid panel (Completed)   Referral to Nutrition and Diabetes Services    Other Visit Diagnoses    Establishing care with new doctor, encounter for       Relevant Orders   CBC with Differential/Platelet (Completed)   Comprehensive metabolic panel (Completed)   Hemoglobin A1c   POCT UA - Microalbumin (Completed)   Lipid panel (Completed)   Ambulatory referral to Ophthalmology   Referral to Nutrition and Diabetes Services      Outpatient Encounter Medications as of 07/18/2020  Medication Sig  . blood glucose meter kit and supplies Dispense based on patient and insurance preference. Use up to four times daily as directed. (FOR ICD-10 E10.9, E11.9).  . insulin aspart (NOVOLOG FLEXPEN) 100 UNIT/ML FlexPen Inject 10 Units into the skin 3 (three) times daily with meals.  . insulin glargine (LANTUS SOLOSTAR) 100 UNIT/ML Solostar Pen Inject 24 Units into the skin 2 (two) times daily.  . Insulin Syringes, Disposable, U-100 0.3 ML MISC Use as directed  . [DISCONTINUED] albuterol (VENTOLIN HFA) 108 (90 Base) MCG/ACT inhaler Inhale 2 puffs into the lungs every 6 (six) hours as needed for wheezing or shortness of breath. (Patient not taking: Reported on 07/18/2020)   No facility-administered  encounter medications on file as of 07/18/2020.   Spent 60 minutes on this patient encounter, including preparation, chart review, face-to-face counseling with patient and coordination of care, and documentation of encounter  Follow-up: Return in about 2 weeks (around 08/01/2020) for Follow Up.   Brezlyn Manrique J [place], MD

## 2020-07-19 LAB — HEMOGLOBIN A1C
Hgb A1c MFr Bld: >15.5 % — ABNORMAL HIGH (ref 4.8–5.6)
Mean Plasma Glucose: >398 mg/dL

## 2020-07-19 LAB — PATHOLOGIST SMEAR REVIEW

## 2020-07-19 NOTE — Assessment & Plan Note (Addendum)
Chronic, uncontrolled Originally diagnosed in September 2021 when patient had to the emergency department in DKA Long discussion today with patient regarding diabetes, appropriate management, prognosis, potential for complications Refer to nutritionist/diabetic educator Discussed appropriate lifestyle modifications Diabetic medications: Continue with Lantus 24 units twice daily and NovoLog 10 units with meals Check blood sugars regularly and record on log provided -check fasting, pre and postprandial in order to make any necessary adjustments on insulin dosage for better control of blood sugars Check A1c, CMP, lipid panel, CBC, MA/CR ratio Refer to ophthalmology for retinopathy screening Recommend pneumococcal vaccination, patient declines at this time Plan for close follow-up with patient to stress importance of gaining appropriate control of diabetes

## 2020-07-19 NOTE — Assessment & Plan Note (Signed)
Lifestyle modifications discussed as above As above, refer to nutritionist Encouraged gradual healthy weight loss, gradual increase in level of physical activity

## 2020-07-21 ENCOUNTER — Encounter (HOSPITAL_BASED_OUTPATIENT_CLINIC_OR_DEPARTMENT_OTHER): Payer: Self-pay

## 2020-07-22 ENCOUNTER — Telehealth (HOSPITAL_BASED_OUTPATIENT_CLINIC_OR_DEPARTMENT_OTHER): Payer: Self-pay

## 2020-07-22 ENCOUNTER — Encounter (HOSPITAL_BASED_OUTPATIENT_CLINIC_OR_DEPARTMENT_OTHER): Payer: Self-pay

## 2020-07-22 NOTE — Telephone Encounter (Signed)
Results released by Dr. de Cuba and reviewed by patient via MyChart Instructed patient to contact the office with any questions or concerns.  

## 2020-07-22 NOTE — Telephone Encounter (Signed)
-----   Message from Hosie Poisson [place], MD sent at 07/21/2020 12:58 PM EDT ----- Normal red blood cell count and hemoglobin.  Total white blood cell count is normal but with slightly low neutrophil count which is a type of white blood cell.  Slightly abnormal electrolytes, likely due to elevated blood sugar.  Hemoglobin A1c is very high and has increased since last measurement in September 2021.  Will need to work diligently to closely monitor blood sugars and improve control of diabetes.  Abnormal cholesterol numbers as well, will likely need to start medication to help control these numbers particularly with associated diabetes.  For the low neutrophil count, plan to repeat labs for this at next office visit.  Schedule for sooner follow-up next week.

## 2020-07-26 ENCOUNTER — Ambulatory Visit (INDEPENDENT_AMBULATORY_CARE_PROVIDER_SITE_OTHER): Payer: Medicaid Other | Admitting: Family Medicine

## 2020-07-26 ENCOUNTER — Other Ambulatory Visit: Payer: Self-pay

## 2020-07-26 DIAGNOSIS — R809 Proteinuria, unspecified: Secondary | ICD-10-CM | POA: Diagnosis not present

## 2020-07-26 DIAGNOSIS — E1129 Type 2 diabetes mellitus with other diabetic kidney complication: Secondary | ICD-10-CM

## 2020-07-26 DIAGNOSIS — Z794 Long term (current) use of insulin: Secondary | ICD-10-CM

## 2020-07-26 NOTE — Patient Instructions (Addendum)
  Medication Instructions:  Your physician recommends that you continue on your current medications as directed. Please refer to the Current Medication list given to you today. --If you need a refill on any your medications before your next appointment, please call your pharmacy first. If no refills are authorized on file call the office.--  Follow-Up: Your next appointment:   Your physician recommends that you schedule a follow-up appointment in: 1 WEEK with Dr. de Peru  Thanks for letting us be apart of your health journey!!  Primary Care and Sports Medicine   Dr. de Peru and Shawna Clamp, DNP, AGNP  We recommend signing up for the patient portal called "MyChart".  Sign up information is provided on this After Visit Summary.  MyChart is used to connect with patients for Virtual Visits (Telemedicine).  Patients are able to view lab/test results, encounter notes, upcoming appointments, etc.  Non-urgent messages can be sent to your provider as well.   To learn more about what you can do with MyChart, please visit --  [email or web address].     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, [place], [place] [address] [phone]

## 2020-07-26 NOTE — Progress Notes (Signed)
    Procedures performed today:    None.  Independent interpretation of notes and tests performed by another provider:   None.  Brief History, Exam, Impression, and Recommendations:    Jacob Esparza is a 20 year old male presenting for follow-up of diabetes.  At prior visit which was an establishing visit, labs were ordered and patient was found to have a hemoglobin A1c that was significantly elevated at >15.5%.  Patient reports that he lost to his blood sugar log that patient was provided with at last appointment and instructed to fill out with fasting as well as pre and postprandial readings.  He reports that he has been taking his insulin as per medication reconciliation.  He denies any symptoms at present including dizziness, sweats, lightheadedness, polyuria.  Does endorse some polydipsia.  BP 118/76   Pulse 78   Ht 5\' 6"  (1.676 m)   Wt 223 lb (101.2 kg)   SpO2 98%   BMI 35.99 kg/m   Diabetes (HCC) Chronic, uncontrolled Discussed lab findings and correlation with present A1c of average blood sugar being greater than 400.  Discussed our concerns with his current management of his diabetes and blood sugars and discussed the numerous increased risks including risks of complications such as retinopathy, kidney disease, heart disease, nerve disease, its negative impact on quality of life, risk of premature death.  Short-term risks with uncontrolled diabetes include experiencing DKA again which patient had problems with in September 2021. Emphasized the importance of monitoring blood sugar and maintaining log and bring this to the next appointment as this will allow October 2021 to more specifically adjust his insulin to work on lowering his blood sugars. Has been referred to diabetic nutritionist/educator who has been trying to reach the patient, however patient has not been answering his phone during the day and does not have space to receive voicemails.  Discussed with patient the importance of  scheduling this appointment. Continue with present dosing of insulin, adjustments to be made once blood sugar log reviewed  Spent 30 minutes on this patient encounter, including preparation, chart review, face-to-face counseling with patient and coordination of care, and documentation of encounter   ___________________________________________ Jacob Verdi de [place], MD, ABFM, CAQSM Primary Care and Sports Medicine [facility]

## 2020-07-26 NOTE — Assessment & Plan Note (Signed)
Chronic, uncontrolled Discussed lab findings and correlation with present A1c of average blood sugar being greater than 400.  Discussed our concerns with his current management of his diabetes and blood sugars and discussed the numerous increased risks including risks of complications such as retinopathy, kidney disease, heart disease, nerve disease, its negative impact on quality of life, risk of premature death.  Short-term risks with uncontrolled diabetes include experiencing DKA again which patient had problems with in September 2021. Emphasized the importance of monitoring blood sugar and maintaining log and bring this to the next appointment as this will allow [place] to more specifically adjust his insulin to work on lowering his blood sugars. Has been referred to diabetic nutritionist/educator who has been trying to reach the patient, however patient has not been answering his phone during the day and does not have space to receive voicemails.  Discussed with patient the importance of scheduling this appointment. Continue with present dosing of insulin, adjustments to be made once blood sugar log reviewed

## 2020-07-27 ENCOUNTER — Encounter (HOSPITAL_BASED_OUTPATIENT_CLINIC_OR_DEPARTMENT_OTHER): Payer: Self-pay | Admitting: Family Medicine

## 2020-08-01 ENCOUNTER — Ambulatory Visit (HOSPITAL_BASED_OUTPATIENT_CLINIC_OR_DEPARTMENT_OTHER): Payer: Medicaid Other | Admitting: Family Medicine

## 2020-08-02 ENCOUNTER — Other Ambulatory Visit: Payer: Self-pay

## 2020-08-02 ENCOUNTER — Encounter (HOSPITAL_BASED_OUTPATIENT_CLINIC_OR_DEPARTMENT_OTHER): Payer: Self-pay | Admitting: Family Medicine

## 2020-08-02 ENCOUNTER — Ambulatory Visit (INDEPENDENT_AMBULATORY_CARE_PROVIDER_SITE_OTHER): Payer: Medicaid Other | Admitting: Family Medicine

## 2020-08-02 VITALS — BP 110/76 | HR 91 | Ht 66.0 in | Wt 223.0 lb

## 2020-08-02 DIAGNOSIS — E1129 Type 2 diabetes mellitus with other diabetic kidney complication: Secondary | ICD-10-CM

## 2020-08-02 DIAGNOSIS — Z794 Long term (current) use of insulin: Secondary | ICD-10-CM

## 2020-08-02 DIAGNOSIS — R809 Proteinuria, unspecified: Secondary | ICD-10-CM | POA: Diagnosis not present

## 2020-08-02 MED ORDER — LANTUS SOLOSTAR 100 UNIT/ML ~~LOC~~ SOPN: 28.0000 [IU] | PEN_INJECTOR | Freq: Every day | SUBCUTANEOUS | 1 refills | Status: DC

## 2020-08-02 NOTE — Assessment & Plan Note (Signed)
Chronic, uncontrolled Discussed need for better glycemic control At this time, will increase Lantus to 28 units once daily, instructed on sliding scale for patient to gradually increase basal insulin -every 3 days if fasting blood sugar remains greater than 180, increase Lantus by 4 units; if fasting blood sugar is 1 30-1 80, increase Lantus by 2 units Continue with current NovoLog dosing Previously referred to diabetic educator, will follow up on this Continue with regular blood sugar checks Plan for follow-up in 2 weeks to recheck blood sugar log, adjust insulin as needed

## 2020-08-02 NOTE — Patient Instructions (Addendum)
  Medication Instructions:  Your physician has recommended you make the following change in your medication:  --INCREASE Lantus - Inject 28 units subcutaneously once daily, Check your blood sugar and if your fasting blood sugar is greater than 180 increase Lantus by 4 units every 3 days. If your fasting blood sugar is between 130-180, increase Lantus by 2 units every 3 days --If you need a refill on any your medications before your next appointment, please call your pharmacy first. If no refills are authorized on file call the office.--  Follow-Up: Your next appointment:   Your physician recommends that you schedule a follow-up appointment in: 2 WEEKS with Dr. de Peru  Thanks for letting us be apart of your health journey!!  Primary Care and Sports Medicine   Dr. de Peru and Shawna Clamp, DNP, AGNP  We recommend signing up for the patient portal called "MyChart".  Sign up information is provided on this After Visit Summary.  MyChart is used to connect with patients for Virtual Visits (Telemedicine).  Patients are able to view lab/test results, encounter notes, upcoming appointments, etc.  Non-urgent messages can be sent to your provider as well.   To learn more about what you can do with MyChart, please visit --  [email or web address].

## 2020-08-02 NOTE — Progress Notes (Signed)
    Procedures performed today:    None.  Independent interpretation of notes and tests performed by another provider:   None.  Brief History, Exam, Impression, and Recommendations:    Jacob Esparza is a 20 year old male presenting for follow-up of uncontrolled diabetes.  Patient has blood sugar log with him today, most readings tend to be between 300-400.  Reports that he has continued with Lantus at 24 units -this is prescribed as twice daily, however primarily takes once daily, will do twice daily sometimes on days when he is not working which is only about 2 days/week.  Reports that he has continued with NovoLog 10 units with meals.  Denies any symptoms of dizziness, sweats, lightheadedness, presyncopal episodes, notable polyuria or polydipsia.  Patient works night shifts, however sleeps during the night on his 2 days off per week.  Tends to sleep from about 8:30 AM to 12 PM or so.  Thus his typical "fasting" blood sugar would be around 2 PM.  BP 110/76   Pulse 91   Ht 5\' 6"  (1.676 m)   Wt 223 lb (101.2 kg)   SpO2 97%   BMI 35.99 kg/m   Diabetes (HCC) Chronic, uncontrolled Discussed need for better glycemic control At this time, will increase Lantus to 28 units once daily, instructed on sliding scale for patient to gradually increase basal insulin -every 3 days if fasting blood sugar remains greater than 180, increase Lantus by 4 units; if fasting blood sugar is 1 30-1 80, increase Lantus by 2 units Continue with current NovoLog dosing Previously referred to diabetic educator, will follow up on this Continue with regular blood sugar checks Plan for follow-up in 2 weeks to recheck blood sugar log, adjust insulin as needed  Spent 30 minutes on this patient encounter, including preparation, chart review, face-to-face counseling with patient and coordination of care, and documentation of encounter  ___________________________________________ Jacob Nuon de , MD, ABFM, CAQSM Primary Care  and Sports Medicine [facility]

## 2020-08-05 ENCOUNTER — Encounter (HOSPITAL_BASED_OUTPATIENT_CLINIC_OR_DEPARTMENT_OTHER): Payer: Self-pay | Admitting: Family Medicine

## 2020-08-08 NOTE — Telephone Encounter (Signed)
Called patient to inform him that a Doctors note can only be written in the context of him being seen and evaluated in the office or to document past medical visits.  Patients states be became sick the day after his last office visit but did not schedule an appointment or go anywhere else to be evaluated. I instructed the patient that for future references if he thinks he has any issues that are going to cause him to have prolonged or reoccuring absences he should consult with his job about FMLA Patient is aware and agreeabale.

## 2020-08-09 ENCOUNTER — Encounter: Payer: Medicaid Other | Attending: Family Medicine | Admitting: Dietician

## 2020-08-10 ENCOUNTER — Ambulatory Visit: Payer: Medicaid Other | Admitting: Dietician

## 2020-08-23 ENCOUNTER — Ambulatory Visit (HOSPITAL_BASED_OUTPATIENT_CLINIC_OR_DEPARTMENT_OTHER): Payer: Medicaid Other | Admitting: Family Medicine

## 2020-08-29 ENCOUNTER — Encounter (HOSPITAL_BASED_OUTPATIENT_CLINIC_OR_DEPARTMENT_OTHER): Payer: Self-pay | Admitting: Family Medicine

## 2020-09-05 ENCOUNTER — Other Ambulatory Visit (HOSPITAL_BASED_OUTPATIENT_CLINIC_OR_DEPARTMENT_OTHER): Payer: Self-pay | Admitting: Family Medicine

## 2020-09-05 ENCOUNTER — Other Ambulatory Visit: Payer: Self-pay

## 2020-09-05 ENCOUNTER — Ambulatory Visit (INDEPENDENT_AMBULATORY_CARE_PROVIDER_SITE_OTHER): Payer: Medicaid Other | Admitting: Family Medicine

## 2020-09-05 ENCOUNTER — Encounter (HOSPITAL_BASED_OUTPATIENT_CLINIC_OR_DEPARTMENT_OTHER): Payer: Self-pay | Admitting: Family Medicine

## 2020-09-05 VITALS — BP 124/94 | HR 67 | Ht 66.0 in | Wt 236.4 lb

## 2020-09-05 DIAGNOSIS — E669 Obesity, unspecified: Secondary | ICD-10-CM

## 2020-09-05 DIAGNOSIS — R809 Proteinuria, unspecified: Secondary | ICD-10-CM

## 2020-09-05 DIAGNOSIS — Z794 Long term (current) use of insulin: Secondary | ICD-10-CM | POA: Diagnosis not present

## 2020-09-05 DIAGNOSIS — E1129 Type 2 diabetes mellitus with other diabetic kidney complication: Secondary | ICD-10-CM | POA: Diagnosis not present

## 2020-09-05 DIAGNOSIS — Z23 Encounter for immunization: Secondary | ICD-10-CM | POA: Diagnosis not present

## 2020-09-05 NOTE — Progress Notes (Signed)
    Procedures performed today:    None.  Independent interpretation of notes and tests performed by another provider:   None.  Brief History, Exam, Impression, and Recommendations:    BP (!) 124/94   Pulse 67   Ht _0  (1.676 m)   Wt 236 lb 6.4 oz (107.2 kg)   SpO2 99%   BMI 38.16 kg/m   Diabetes (HCC) Fasting blood sugar numbers have improved since last visit, reports that recent averages have been around the 120s Continues with mealtime NovoLog as well Still has not met with diabetic educator/nutritionist Indicates about 3 episodes of having symptomatic hypoglycemia, however patient has not checked his blood sugar during these times Denies any polyuria or polydipsia Continue with current dosing of Lantus, now is at 45 units daily, continue with NovoLog Strongly encourage arranging appointment with nutritionist Eye exam: Has been referred, not arranged yet Pneumococcal vaccination: Will be completed today Foot exam: Completed Urine microalbumin: Moderately increased on initial testing in May 2022, plan for retesting at 3 and 6 months to assess if persistently elevated Patient may benefit from use of device for continuous glucose monitoring, would need to check insurance coverage for this  Obesity, Class II, BMI 35-39.9 Continue with lifestyle modifications, as above, encouraged to reschedule appointment with nutritionist  Plan for follow-up in about 4 to 6 weeks or sooner as needed.  Recheck hemoglobin A1c and microalbumin/creatinine ratio at next visit.  ___________________________________________ Lindaann Gradilla de [place], MD, ABFM, [facility] and [place]

## 2020-09-05 NOTE — Assessment & Plan Note (Signed)
Continue with lifestyle modifications, as above, encouraged to reschedule appointment with nutritionist

## 2020-09-05 NOTE — Assessment & Plan Note (Signed)
Fasting blood sugar numbers have improved since last visit, reports that recent averages have been around the 120s Continues with mealtime NovoLog as well Still has not met with diabetic educator/nutritionist Indicates about 3 episodes of having symptomatic hypoglycemia, however patient has not checked his blood sugar during these times Denies any polyuria or polydipsia Continue with current dosing of Lantus, now is at 45 units daily, continue with NovoLog Strongly encourage arranging appointment with nutritionist Eye exam: Has been referred, not arranged yet Pneumococcal vaccination: Will be completed today Foot exam: Completed Urine microalbumin: Moderately increased on initial testing in May 2022, plan for retesting at 3 and 6 months to assess if persistently elevated Patient may benefit from use of device for continuous glucose monitoring, would need to check insurance coverage for this

## 2020-09-05 NOTE — Patient Instructions (Signed)
  Medication Instructions:  Your physician recommends that you continue on your current medications as directed. Please refer to the Current Medication list given to you today. --If you need a refill on any your medications before your next appointment, please call your pharmacy first. If no refills are authorized on file call the office.--  Follow-Up: Your next appointment:   Your physician recommends that you schedule a follow-up appointment in: 6 WEEKS with Dr. de Cuba  Thanks for letting us be apart of your health journey!!  Primary Care and Sports Medicine   Dr. Raymond de Cuba   We encourage you to activate your patient portal called "MyChart".  Sign up information is provided on this After Visit Summary.  MyChart is used to connect with patients for Virtual Visits (Telemedicine).  Patients are able to view lab/test results, encounter notes, upcoming appointments, etc.  Non-urgent messages can be sent to your provider as well. To learn more about what you can do with MyChart, please visit --  [email or web address].    

## 2020-10-17 ENCOUNTER — Ambulatory Visit (INDEPENDENT_AMBULATORY_CARE_PROVIDER_SITE_OTHER): Payer: Medicaid Other | Admitting: Family Medicine

## 2020-10-17 ENCOUNTER — Encounter (HOSPITAL_BASED_OUTPATIENT_CLINIC_OR_DEPARTMENT_OTHER): Payer: Self-pay | Admitting: Family Medicine

## 2020-10-17 ENCOUNTER — Other Ambulatory Visit: Payer: Self-pay

## 2020-10-17 VITALS — BP 122/90 | HR 92 | Ht 66.0 in | Wt 220.0 lb

## 2020-10-17 DIAGNOSIS — Z794 Long term (current) use of insulin: Secondary | ICD-10-CM

## 2020-10-17 DIAGNOSIS — E669 Obesity, unspecified: Secondary | ICD-10-CM

## 2020-10-17 DIAGNOSIS — R809 Proteinuria, unspecified: Secondary | ICD-10-CM | POA: Diagnosis not present

## 2020-10-17 DIAGNOSIS — E1129 Type 2 diabetes mellitus with other diabetic kidney complication: Secondary | ICD-10-CM | POA: Diagnosis not present

## 2020-10-17 NOTE — Assessment & Plan Note (Signed)
Is no longer working nights and feels that this has made a positive impact on lifestyle choices Because of his changes, patient has lost about 15 pounds since last visit a little over 1 month ago Encouraged to continue with these changes Encouraged to schedule appointment with nutritionist

## 2020-10-17 NOTE — Assessment & Plan Note (Signed)
Reports that fasting blood sugars have been around 120 Has been using about 50 units of Lantus daily, infrequently using NovoLog Has not met with nutritionist yet Now is working day shift which has been beneficial for patient, indicates that he has been able to make better lifestyle choices As a result of these lifestyle modifications, patient has lost about 15 pounds since last visit Encouraged to arrange appointment with nutritionist Previously referred for eye exam Continue with Lantus Plan for follow-up in 6 weeks, at that time check A1c and microalbumin/creatinine ratio

## 2020-10-17 NOTE — Patient Instructions (Signed)
  Medication Instructions:  Your physician recommends that you continue on your current medications as directed. Please refer to the Current Medication list given to you today. --If you need a refill on any your medications before your next appointment, please call your pharmacy first. If no refills are authorized on file call the office.--  Follow-Up: Your next appointment:   Your physician recommends that you schedule a follow-up appointment in: 6 WEEKS with Dr. de Cuba  Thanks for letting us be apart of your health journey!!  Primary Care and Sports Medicine   Dr. Raymond de Cuba   We encourage you to activate your patient portal called "MyChart".  Sign up information is provided on this After Visit Summary.  MyChart is used to connect with patients for Virtual Visits (Telemedicine).  Patients are able to view lab/test results, encounter notes, upcoming appointments, etc.  Non-urgent messages can be sent to your provider as well. To learn more about what you can do with MyChart, please visit --  [email or web address].    

## 2020-10-17 NOTE — Progress Notes (Signed)
    Procedures performed today:    None.  Independent interpretation of notes and tests performed by another provider:   None.  Brief History, Exam, Impression, and Recommendations:    BP 122/90   Pulse 92   Ht $R'5\' 6"'tS$  (1.676 m)   Wt 220 lb (99.8 kg)   SpO2 97%   BMI 35.51 kg/m   Diabetes (HCC) Reports that fasting blood sugars have been around 120 Has been using about 50 units of Lantus daily, infrequently using NovoLog Has not met with nutritionist yet Now is working day shift which has been beneficial for patient, indicates that he has been able to make better lifestyle choices As a result of these lifestyle modifications, patient has lost about 15 pounds since last visit Encouraged to arrange appointment with nutritionist Previously referred for eye exam Continue with Lantus Plan for follow-up in 6 weeks, at that time check A1c and microalbumin/creatinine ratio  Obesity, Class II, BMI 35-39.9 Is no longer working nights and feels that this has made a positive impact on lifestyle choices Because of his changes, patient has lost about 15 pounds since last visit a little over 1 month ago Encouraged to continue with these changes Encouraged to schedule appointment with nutritionist   ___________________________________________ Dawn Kiper de [place], MD, ABFM, [facility] [place]

## 2020-11-28 ENCOUNTER — Ambulatory Visit (INDEPENDENT_AMBULATORY_CARE_PROVIDER_SITE_OTHER): Payer: Medicaid Other | Admitting: Family Medicine

## 2020-11-28 ENCOUNTER — Other Ambulatory Visit: Payer: Self-pay

## 2020-11-28 ENCOUNTER — Encounter (HOSPITAL_BASED_OUTPATIENT_CLINIC_OR_DEPARTMENT_OTHER): Payer: Self-pay | Admitting: Family Medicine

## 2020-11-28 VITALS — BP 134/90 | HR 72 | Ht 66.0 in | Wt 245.0 lb

## 2020-11-28 DIAGNOSIS — R809 Proteinuria, unspecified: Secondary | ICD-10-CM

## 2020-11-28 DIAGNOSIS — L987 Excessive and redundant skin and subcutaneous tissue: Secondary | ICD-10-CM

## 2020-11-28 DIAGNOSIS — E1129 Type 2 diabetes mellitus with other diabetic kidney complication: Secondary | ICD-10-CM | POA: Diagnosis not present

## 2020-11-28 DIAGNOSIS — E669 Obesity, unspecified: Secondary | ICD-10-CM

## 2020-11-28 DIAGNOSIS — Z794 Long term (current) use of insulin: Secondary | ICD-10-CM

## 2020-11-28 DIAGNOSIS — M545 Low back pain, unspecified: Secondary | ICD-10-CM

## 2020-11-28 DIAGNOSIS — G8929 Other chronic pain: Secondary | ICD-10-CM

## 2020-11-28 LAB — POCT UA - MICROALBUMIN
Albumin/Creatinine Ratio, Urine, POC: <30
Creatinine, POC: 300 mg/dL
Microalbumin Ur, POC: 10 mg/L

## 2020-11-28 NOTE — Patient Instructions (Addendum)
Medication Instructions:  Your physician recommends that you continue on your current medications as directed. Please refer to the Current Medication list given to you today. --If you need a refill on any your medications before your next appointment, please call your pharmacy first. If no refills are authorized on file call the office.--  Lab Work: Your physician has recommended that you have lab work today: CBC, CMP, Urine Microalbumin, and A1C If you have labs (blood work) drawn today and your tests are completely normal, you will receive your results via MyChart message OR a phone call from our staff.  Please ensure you check your voicemail in the event that you authorized detailed messages to be left on a delegated number. If you have any lab test that is abnormal or we need to change your treatment, we will call you to review the results.  Referrals/Procedures/Imaging: A referral has been placed for you to go to Outpatient Physical Therapy at [facility]. Someone from the scheduling department will be in contact with you in regards to coordinating your consultation. If you do not hear from any of the schedulers within 7-10 business days please give our office a call.  [facility] at [facility] on the 1st Floor Hours of Operation: Monday - Friday 8:00 am - 5:00 pm Closed on weekends and all major holidays (P) [phone]  A referral has been placed for you to [facility] for evaluation and treatment. Someone from the scheduling department will be in contact with you in regards to coordinating your consultation. If you do not hear from any of the schedulers within 7-10 business days please give our office a call.  Your physician recommends that you have an XRAY of your lower back. X-rays are a type of radiation called electromagnetic waves. X-ray imaging creates pictures of the inside of your body. The images show the parts of  your body in different shades of black and white. This is because different tissues absorb different amounts of radiation. Calcium in bones absorbs x-rays the most, so bones look white. Fat and other soft tissues absorb less and look gray. Air absorbs the least, so lungs look black     1. You may have this done at the [facility], located in the [facility] Building on the 1st floor.    2. You do no have to have an appointment.    3. [address] [place], [place] [address]        [phone]        Monday - Friday  8:00 am - 5:00 pm 4. [facility]  [facility] at [facility]. [facility] at [facility] is a premier diagnostic imaging center that offers a 64-slice CT scanner and interventional radiology services.'  Follow-Up: Your next appointment:   Your physician recommends that you schedule a follow-up appointment in: 4-6 WEEKS with Dr. de [place]  Thanks for letting us be apart of your health journey!!  Primary Care and Sports Medicine   Dr. Ceasar Mons [place]   We encourage you to activate your patient portal called "MyChart".  Sign up information is provided on this After Visit Summary.  MyChart is used to connect with patients for Virtual Visits (Telemedicine).  Patients are able to view lab/test results, encounter notes, upcoming appointments, etc.  Non-urgent messages can be sent to your provider as well. To learn more about what you  can do with MyChart, please visit --  [email or web address].

## 2020-11-28 NOTE — Assessment & Plan Note (Signed)
New issue being addressed Reports the pain has been going on for about 2 to 3 years.  Primarily over mid to lower back.  Thinks it might have been due to an injury during football Will have some associated left upper thigh pain, denies any other radiation of symptoms Denies any associated numbness or tingling Has tried icy hot, ibuprofen, muscle relaxer Pain is worse with heavy lifting, bending over, sleeping the wrong way On exam of the lumbar spine, visual inspection without obvious abnormality No tenderness to palpation over spinous processes, mild tenderness palpation through paraspinal musculature bilaterally Reduced forward flexion, mild pain elicited.  Normal back extension, no pain elicited. Given duration of symptoms, will proceed with x-rays Pending results of x-rays, recommend evaluation and treatment with physical therapy, home exercise program as per PT Can continue with conservative measures for pain control including OTC medications

## 2020-11-28 NOTE — Assessment & Plan Note (Signed)
Continues with 50 units of Lantus daily.  Reports that fasting blood sugars are the same as before Did not establish with nutritionist/diabetic educator Today with increased weight from last visit of about 20 pounds. Due for repeat labs including A1c, microalbumin creatinine ratio Discussed weight increase, will refer again to diabetic educator, patient is no longer working nights and so should be able to arrange for appointment

## 2020-11-28 NOTE — Progress Notes (Signed)
    Procedures performed today:    None.  Independent interpretation of notes and tests performed by another provider:   None.  Brief History, Exam, Impression, and Recommendations:    BP 134/90   Pulse 72   Ht 5\' 6"  (1.676 m)   Wt 245 lb (111.1 kg)   SpO2 98%   BMI 39.54 kg/m   Diabetes (HCC) Continues with 50 units of Lantus daily.  Reports that fasting blood sugars are the same as before Did not establish with nutritionist/diabetic educator Today with increased weight from last visit of about 20 pounds. Due for repeat labs including A1c, microalbumin creatinine ratio Discussed weight increase, will refer again to diabetic educator, patient is no longer working nights and so should be able to arrange for appointment  Excess skin Reports having excess skin, primarily over chest which is bothersome.  Desiring referral to specialist to discuss treatment options Referral placed to plastic surgery for evaluation, discussion regarding surgical options related to excess skin removal  Low back pain New issue being addressed Reports the pain has been going on for about 2 to 3 years.  Primarily over mid to lower back.  Thinks it might have been due to an injury during football Will have some associated left upper thigh pain, denies any other radiation of symptoms Denies any associated numbness or tingling Has tried icy hot, ibuprofen, muscle relaxer Pain is worse with heavy lifting, bending over, sleeping the wrong way On exam of the lumbar spine, visual inspection without obvious abnormality No tenderness to palpation over spinous processes, mild tenderness palpation through paraspinal musculature bilaterally Reduced forward flexion, mild pain elicited.  Normal back extension, no pain elicited. Given duration of symptoms, will proceed with x-rays Pending results of x-rays, recommend evaluation and treatment with physical therapy, home exercise program as per PT Can continue with  conservative measures for pain control including OTC medications  Obesity, Class II, BMI 35-39.9 Has had some weight gain since last appointment As above, discussed referral to nutritionist/diabetic educator, patient willing to proceed with this Monitor weight at subsequent visits  Plan for follow-up in about 4 to 6 weeks or sooner as needed Follow-up on low back pain, diabetes.  Will also discuss patient's shoulder pain.   ___________________________________________ Domique Clapper de , MD, ABFM, [facility] and [facility]

## 2020-11-28 NOTE — Assessment & Plan Note (Signed)
Reports having excess skin, primarily over chest which is bothersome.  Desiring referral to specialist to discuss treatment options Referral placed to plastic surgery for evaluation, discussion regarding surgical options related to excess skin removal

## 2020-11-28 NOTE — Assessment & Plan Note (Signed)
Has had some weight gain since last appointment As above, discussed referral to nutritionist/diabetic educator, patient willing to proceed with this Monitor weight at subsequent visits

## 2020-11-29 LAB — CBC WITH DIFFERENTIAL/PLATELET
Basophils Absolute: 0 10*3/uL (ref 0.0–0.2)
Basos: 1 %
EOS (ABSOLUTE): 0 10*3/uL (ref 0.0–0.4)
Eos: 1 %
Hematocrit: 47.7 % (ref 37.5–51.0)
Hemoglobin: 15.8 g/dL (ref 13.0–17.7)
Immature Grans (Abs): 0 10*3/uL (ref 0.0–0.1)
Immature Granulocytes: 0 %
Lymphocytes Absolute: 2.4 10*3/uL (ref 0.7–3.1)
Lymphs: 56 %
MCH: 29.3 pg (ref 26.6–33.0)
MCHC: 33.1 g/dL (ref 31.5–35.7)
MCV: 89 fL (ref 79–97)
Monocytes Absolute: 0.4 10*3/uL (ref 0.1–0.9)
Monocytes: 8 %
Neutrophils Absolute: 1.5 10*3/uL (ref 1.4–7.0)
Neutrophils: 34 %
Platelets: 265 10*3/uL (ref 150–450)
RBC: 5.39 x10E6/uL (ref 4.14–5.80)
RDW: 13.3 % (ref 11.6–15.4)
WBC: 4.3 10*3/uL (ref 3.4–10.8)

## 2020-11-29 LAB — COMPREHENSIVE METABOLIC PANEL
ALT: 22 IU/L (ref 0–44)
AST: 12 IU/L (ref 0–40)
Albumin/Globulin Ratio: 1.6 (ref 1.2–2.2)
Albumin: 4.4 g/dL (ref 4.1–5.2)
Alkaline Phosphatase: 119 IU/L (ref 44–121)
BUN/Creatinine Ratio: 15 (ref 9–20)
BUN: 13 mg/dL (ref 6–20)
Bilirubin Total: 0.2 mg/dL (ref 0.0–1.2)
CO2: 24 mmol/L (ref 20–29)
Calcium: 9.4 mg/dL (ref 8.7–10.2)
Chloride: 103 mmol/L (ref 96–106)
Creatinine, Ser: 0.88 mg/dL (ref 0.76–1.27)
Globulin, Total: 2.8 g/dL (ref 1.5–4.5)
Glucose: 117 mg/dL — ABNORMAL HIGH (ref 65–99)
Potassium: 4.4 mmol/L (ref 3.5–5.2)
Sodium: 143 mmol/L (ref 134–144)
Total Protein: 7.2 g/dL (ref 6.0–8.5)
eGFR: 125 mL/min/{1.73_m2} (ref >59)

## 2020-11-29 LAB — HEMOGLOBIN A1C
Est. average glucose Bld gHb Est-mCnc: 154 mg/dL
Hgb A1c MFr Bld: 7 % — ABNORMAL HIGH (ref 4.8–5.6)

## 2020-12-20 ENCOUNTER — Encounter (HOSPITAL_BASED_OUTPATIENT_CLINIC_OR_DEPARTMENT_OTHER): Payer: Self-pay

## 2021-01-09 ENCOUNTER — Ambulatory Visit (HOSPITAL_BASED_OUTPATIENT_CLINIC_OR_DEPARTMENT_OTHER): Payer: Medicaid Other | Admitting: Family Medicine

## 2021-01-20 NOTE — Progress Notes (Deleted)
HPI

## 2021-01-23 ENCOUNTER — Ambulatory Visit (HOSPITAL_BASED_OUTPATIENT_CLINIC_OR_DEPARTMENT_OTHER): Payer: Medicaid Other | Admitting: Family Medicine

## 2021-01-23 ENCOUNTER — Encounter (HOSPITAL_BASED_OUTPATIENT_CLINIC_OR_DEPARTMENT_OTHER): Payer: Self-pay

## 2021-02-14 ENCOUNTER — Other Ambulatory Visit (HOSPITAL_BASED_OUTPATIENT_CLINIC_OR_DEPARTMENT_OTHER): Payer: Self-pay | Admitting: Family Medicine

## 2021-02-20 ENCOUNTER — Encounter (HOSPITAL_BASED_OUTPATIENT_CLINIC_OR_DEPARTMENT_OTHER): Payer: Self-pay | Admitting: Family Medicine
